# Patient Record
Sex: Female | Born: 1975 | Race: White | Hispanic: No | Marital: Married | Smoking: Never smoker
Health system: Southern US, Community
[De-identification: ages and names within clinical notes are randomized; demographics above are authoritative.]

## PROBLEM LIST (undated history)

## (undated) DIAGNOSIS — Z0000 Encounter for general adult medical examination without abnormal findings: Secondary | ICD-10-CM

## (undated) DIAGNOSIS — Z789 Other specified health status: Secondary | ICD-10-CM

## (undated) HISTORY — PX: OTHER SURGICAL HISTORY: SHX169

## (undated) HISTORY — DX: Encounter for general adult medical examination without abnormal findings: Z00.00

---
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
## 2007-09-11 ENCOUNTER — Ambulatory Visit (HOSPITAL_COMMUNITY): Admission: RE | Admit: 2007-09-11 | Discharge: 2007-09-11 | Payer: Self-pay | Admitting: Obstetrics

## 2008-06-29 ENCOUNTER — Inpatient Hospital Stay (HOSPITAL_COMMUNITY): Admission: AD | Admit: 2008-06-29 | Discharge: 2008-06-29 | Payer: Self-pay | Admitting: Obstetrics and Gynecology

## 2009-01-03 ENCOUNTER — Inpatient Hospital Stay (HOSPITAL_COMMUNITY): Admission: AD | Admit: 2009-01-03 | Discharge: 2009-01-07 | Payer: Self-pay | Admitting: Obstetrics

## 2009-01-03 ENCOUNTER — Inpatient Hospital Stay (HOSPITAL_COMMUNITY): Admission: AD | Admit: 2009-01-03 | Discharge: 2009-01-03 | Payer: Self-pay | Admitting: Obstetrics

## 2009-01-04 ENCOUNTER — Encounter (INDEPENDENT_AMBULATORY_CARE_PROVIDER_SITE_OTHER): Payer: Self-pay | Admitting: Obstetrics

## 2010-11-19 LAB — HIV ANTIBODY (ROUTINE TESTING W REFLEX): HIV: NONREACTIVE

## 2010-11-19 LAB — ABO/RH: RH Type: POSITIVE

## 2011-02-14 LAB — CBC
HCT: 31.9 % — ABNORMAL LOW (ref 36.0–46.0)
HCT: 41.3 % (ref 36.0–46.0)
MCHC: 34.5 g/dL (ref 30.0–36.0)
MCHC: 34.7 g/dL (ref 30.0–36.0)
MCV: 98.9 fL (ref 78.0–100.0)
Platelets: 206 10*3/uL (ref 150–400)
RBC: 4.2 MIL/uL (ref 3.87–5.11)
RDW: 12.5 % (ref 11.5–15.5)
WBC: 13.1 10*3/uL — ABNORMAL HIGH (ref 4.0–10.5)

## 2011-03-19 NOTE — Op Note (Signed)
NAME:  Yvette Ross, CUTBIRTH NO.:  [id number]   MEDICAL RECORD NO.:  [id number]          PATIENT TYPE:  INP   LOCATION:  9106                          FACILITY:  [facility]   PHYSICIAN:  Lendon Colonel, MD   DATE OF BIRTH:  1976-02-24   DATE OF PROCEDURE:  01/04/2009  DATE OF DISCHARGE:                               OPERATIVE REPORT   PREOPERATIVE DIAGNOSES:  Arrest of descent, chorioamnionitis, fetal  tachycardia, and nonreassuring fetal heart tones.   POSTOPERATIVE DIAGNOSES:  Arrest of descent, chorioamnionitis, fetal  tachycardia, and nonreassuring fetal heart tones.   PROCEDURE:  Primary low transverse cesarean section.   SURGEON:  Lendon Colonel, MD   ASSISTANT:  Maxie Better, MD   ANESTHESIA:  Epidural.   FINDINGS:  Female infant in the ROP position, Apgars 2 and 7, weight 7  pounds 5 ounces.  Cord pH 7.17.  Normal uterus, tubes, and ovaries.  Thick meconium.   SPECIMENS:  Placenta, cord blood to Pathology.   ANTIBIOTICS:  Ampicillin 2 g, 80 mg of gentamicin, and 900 mg of  clindamycin.  Clindamycin was after cord clamp.   ESTIMATED BLOOD LOSS:  800.   COMPLICATIONS:  None.   INDICATION:  This is a 35 year old G2, P0 at 43 plus weeks admitted in  spontaneous labor.  The patient with augmentation eventually came to  fully dilated push from the +2 position for 2-1/4 hours with no further  descent, at which time deep variable decelerations and fetal tachycardia  to the 190s were noted.  Maternal fever was also noted.  Decision was  made to proceed with C-section.   PROCEDURE IN DETAIL:  After informed consent was obtained, the patient  was taken to the operating room where after approximately 10 minutes,  epidural anesthesia was found to be adequate.  She was prepped and  draped in the normal sterile fashion.  A Foley catheter had been  inserted in the labor room.  A Pfannenstiel skin incision was made 2 cm  above the pubic symphysis in the  midline with the scalpel, carried  through to the underlying layer of fascia with the Bovie cautery.  The  fascia was incised in the midline and the incision was then extended  laterally with the Mayo scissors.  The inferior aspect of the fascial  incision was grasped with Kocher clamps, elevated up, and the rectus  muscles were dissected off sharply.  The superior aspect of the fascial  incision was grasped with the Kocher clamps, elevated up, and the rectus  muscles were dissected off sharply.  The rectus muscles were separated  sharply with the Mayo scissors in the midline.  The peritoneum was  identified, grasped with pickups x2, and entered sharply.  Of note, the  bladder was not draining well and was noted to be quite high.  The  peritoneal incision was extended superiorly and inferiorly with good  visualization of the bladder.  The bladder blade was inserted.  The  vesicouterine peritoneum was identified, grasped with pickups, and  entered sharply with the Metzenbaum scissors.  The incision was extended  laterally and  a generous bladder flap was created digitally.  The  bladder blade was reinserted, DeLee uterine segment was incised in the  transverse fashion with the scalpel.  The amniotic sac was entered  bluntly.  Thick meconium was noted.  The incision was extended bluntly.  The infant's occiput was grasped, large amount of caput was noted, and  the head was quite low.  However, once the head was grasped, the infant  was rotated and brought to the incision.  The cord was clamped and cut,  and the infant was taken to the awaiting pediatrician for deep  suctioning.  No meconium was noted below the vocal cords.  The placenta  was expressed.  The uterus was cleared of all clots and debris.  The  uterus was unable to be exteriorized given the tight abdominal wall  musculature.  T clamps and rings were placed on the edge of the  incision.  The uterus was again cleaned.  The incision  was tied with 0  Vicryl in a running locked fashion.  Second layer of the same suture was  used in imbricating fashion to obtain excellent hemostasis.  The pelvis  was irrigated.  The ovaries were palpated and then visualized.  Again,  both were normal.  No cystic structures.  The tagged edges of the uterus  were reinspected, found to be hemostatic.  The tagged edges were cut.  After the pelvis was irrigated, the peritoneum was closed with 2-0  Vicryl in a running fashion.  A single loose 2-0 Vicryl suture was used  to reapproximate the rectus muscles in the midline, and the fascia was  closed with 0 Vicryl in a running fashion in two half.  Subcutaneous  tissue was irrigated and the skin was closed with staples.  The patient  tolerated the procedure well.  Sponge, lap, and needle counts were  correct x3, and the patient was taken to the recovery room in stable  condition.      Lendon Colonel, MD  Electronically Signed     KAF/MEDQ  D:  01/04/2009  T:  01/04/2009  Job:  [id number]

## 2011-03-22 NOTE — Discharge Summary (Signed)
NAME:  Yvette Ross, Yvette Ross NO.:  [id number]   MEDICAL RECORD NO.:  [id number]          PATIENT TYPE:  INP   LOCATION:  9106                          FACILITY:  [facility]   PHYSICIAN:  Lendon Colonel, MD   DATE OF BIRTH:  21-Dec-1975   DATE OF ADMISSION:  01/03/2009  DATE OF DISCHARGE:  01/07/2009                               DISCHARGE SUMMARY   CHIEF COMPLAINT:  Induction of labor.   HISTORY OF PRESENT ILLNESS:  This is a 35 year old G2 P0 at 39 and 3 who  presented in early labor with painful contractions.  Prenatal issues  significant for a conception on Clomid and subsequent 9 cm ovarian cyst.  A fetal growth was followed and was normal.  The remainder of past  medical, surgical, obstetrical histories can be seen by her admission H  and P.  On admission, her cervix was 3 cm dilated, 70% effaced with the  vertex at -2 revealed tracing was reactive.   HOSPITAL COURSE:  The patient was admitted in early labor and once 6-cm  dilated and the patient received an epidural, she was artificially  ruptured.  Thick meconium was noted.  Pitocin had been used for  augmentation.  The patient was allowed continued expectant management,  though slow progress of labor was noted and IUPC was placed.  The  patient did slowly progressed to fully dilate in the +3 station.  Temperature maternal had risen to 100.6 consistent with  chorioamnionitis.  Ampicillin and gentamicin were begun and occasional  fetal decelerations were noted.  Given the +3 station and worsening  fetal status, thought was had to a vacuum-assisted vaginal delivery;  however, given the large estimated fetal size, concern for dystocia was  present and the decision was made to proceed with a cesarean section.  A  primary low transverse cesarean section was had for arrest of descent,  chorioamnionitis, and nonreassuring fetal heart testing, produced a  female infant, Apgars 2 and 7 with cord pH of 7.17, weight of 7 pounds  5  ounces.  Normal uterus, tubes, and ovaries, and the EBL of 800 mL.  Postoperatively, the patient recovered well and by postop day #3, she  was ambulating, voiding, tolerating a regular p.o.  Her pain was  controlled with p.o. medicine and the patient was discharged home.   DISCHARGE INSTRUCTIONS:  Call with fever, increased pain, heavy  bleeding.   DISCHARGE MEDICATIONS:  Motrin, Percocet, Colace.   DISCHARGE DISPOSITION:  To home.   DISCHARGE CONDITION:  Stable.   DISCHARGE DIAGNOSIS:  Status post primary low transverse cesarean  section for chorioamnionitis, fetal arrest of descent, nonreassuring  fetal testing.      Lendon Colonel, MD  Electronically Signed    KAF/MEDQ  D:  02/10/2009  T:  02/11/2009  Job:  [id number]

## 2011-06-28 ENCOUNTER — Encounter (HOSPITAL_COMMUNITY): Payer: Self-pay | Admitting: *Deleted

## 2011-06-28 ENCOUNTER — Inpatient Hospital Stay (HOSPITAL_COMMUNITY)
Admission: AD | Admit: 2011-06-28 | Discharge: 2011-07-01 | DRG: 371 | Disposition: A | Discharge status: Home / Self Care | Payer: BC Managed Care – PPO | Source: Ambulatory Visit | Attending: Obstetrics | Admitting: Obstetrics

## 2011-06-28 ENCOUNTER — Inpatient Hospital Stay (HOSPITAL_COMMUNITY): Payer: BC Managed Care – PPO | Admitting: Anesthesiology

## 2011-06-28 ENCOUNTER — Inpatient Hospital Stay (HOSPITAL_COMMUNITY): Payer: BC Managed Care – PPO

## 2011-06-28 ENCOUNTER — Encounter (HOSPITAL_COMMUNITY): Payer: Self-pay | Admitting: Anesthesiology

## 2011-06-28 ENCOUNTER — Encounter (HOSPITAL_COMMUNITY)
Admission: AD | Disposition: A | Discharge status: Home / Self Care | Payer: Self-pay | Source: Ambulatory Visit | Attending: Obstetrics

## 2011-06-28 DIAGNOSIS — O34219 Maternal care for unspecified type scar from previous cesarean delivery: Secondary | ICD-10-CM | POA: Diagnosis present

## 2011-06-28 HISTORY — DX: Other specified health status: Z78.9

## 2011-06-28 LAB — TYPE AND SCREEN
ABO/RH(D): A POS
Antibody Screen: NEGATIVE

## 2011-06-28 LAB — RPR: RPR Ser Ql: NONREACTIVE

## 2011-06-28 LAB — CBC
HCT: 40.9 % (ref 36.0–46.0)
HCT: 37.8 % (ref 36.0–46.0)
Hemoglobin: 13.2 g/dL (ref 12.0–15.0)
MCH: 33 pg (ref 26.0–34.0)
MCHC: 35.2 g/dL (ref 30.0–36.0)
RBC: 4.36 MIL/uL (ref 3.87–5.11)
RBC: 4.02 MIL/uL (ref 3.87–5.11)
WBC: 11.6 10*3/uL — ABNORMAL HIGH (ref 4.0–10.5)

## 2011-06-28 SURGERY — Surgical Case
Anesthesia: Epidural | Site: Abdomen | Wound class: Clean Contaminated

## 2011-06-28 MED ORDER — OXYCODONE-ACETAMINOPHEN 5-325 MG PO TABS: 2.0000 | ORAL_TABLET | ORAL | Status: DC | PRN

## 2011-06-28 MED ORDER — ONDANSETRON HCL 4 MG/2ML IJ SOLN
INTRAMUSCULAR | Status: AC
  Filled 2011-06-28: qty 2

## 2011-06-28 MED ORDER — TERBUTALINE SULFATE 1 MG/ML IJ SOLN
INTRAMUSCULAR | Status: AC
  Administered 2011-06-28: 0.25 mg via SUBCUTANEOUS
  Filled 2011-06-28: qty 1

## 2011-06-28 MED ORDER — STERILE WATER FOR IRRIGATION IR SOLN
Status: DC | PRN
  Administered 2011-06-28: 20:00:00 via INTRAVESICAL

## 2011-06-28 MED ORDER — LIDOCAINE HCL (PF) 1 % IJ SOLN
30.0000 mL | INTRAMUSCULAR | Status: DC | PRN
  Filled 2011-06-28: qty 30

## 2011-06-28 MED ORDER — SIMETHICONE 80 MG PO CHEW
80.0000 mg | CHEWABLE_TABLET | Freq: Three times a day (TID) | ORAL | Status: DC
  Administered 2011-06-29 – 2011-07-01 (×7): 80 mg via ORAL

## 2011-06-28 MED ORDER — MORPHINE SULFATE 0.5 MG/ML IJ SOLN
INTRAMUSCULAR | Status: AC
  Filled 2011-06-28: qty 10

## 2011-06-28 MED ORDER — OXYTOCIN 20 UNITS IN LACTATED RINGERS INFUSION - SIMPLE
INTRAVENOUS | Status: DC | PRN
  Administered 2011-06-28: 20 [IU] via INTRAVENOUS

## 2011-06-28 MED ORDER — ONDANSETRON HCL 4 MG/2ML IJ SOLN
INTRAMUSCULAR | Status: DC | PRN
  Administered 2011-06-28: 4 mg via INTRAVENOUS

## 2011-06-28 MED ORDER — KETOROLAC TROMETHAMINE 30 MG/ML IJ SOLN: 30.0000 mg | Freq: Four times a day (QID) | INTRAMUSCULAR | Status: DC | PRN

## 2011-06-28 MED ORDER — FENTANYL CITRATE 0.05 MG/ML IJ SOLN
INTRAMUSCULAR | Status: AC
  Filled 2011-06-28: qty 2

## 2011-06-28 MED ORDER — CEFAZOLIN SODIUM 1-5 GM-% IV SOLN
INTRAVENOUS | Status: DC | PRN
  Administered 2011-06-28: 1 g via INTRAVENOUS

## 2011-06-28 MED ORDER — SENNOSIDES-DOCUSATE SODIUM 8.6-50 MG PO TABS
2.0000 | ORAL_TABLET | Freq: Every day | ORAL | Status: DC
  Administered 2011-06-29: 2 via ORAL

## 2011-06-28 MED ORDER — DIPHENHYDRAMINE HCL 50 MG/ML IJ SOLN: 25.0000 mg | INTRAMUSCULAR | Status: DC | PRN

## 2011-06-28 MED ORDER — TETANUS-DIPHTH-ACELL PERTUSSIS 5-2.5-18.5 LF-MCG/0.5 IM SUSP
0.5000 mL | Freq: Once | INTRAMUSCULAR | Status: AC
  Administered 2011-06-29: 0.5 mL via INTRAMUSCULAR
  Filled 2011-06-28: qty 0.5

## 2011-06-28 MED ORDER — OXYTOCIN 10 UNIT/ML IJ SOLN
INTRAMUSCULAR | Status: AC
  Filled 2011-06-28: qty 2

## 2011-06-28 MED ORDER — SODIUM BICARBONATE 8.4 % IV SOLN
INTRAVENOUS | Status: AC
  Filled 2011-06-28: qty 50

## 2011-06-28 MED ORDER — MORPHINE SULFATE (PF) 0.5 MG/ML IJ SOLN
INTRAMUSCULAR | Status: DC | PRN
  Administered 2011-06-28: 3 mg via EPIDURAL

## 2011-06-28 MED ORDER — IBUPROFEN 600 MG PO TABS
600.0000 mg | ORAL_TABLET | Freq: Four times a day (QID) | ORAL | Status: DC
  Administered 2011-06-29 – 2011-07-01 (×8): 600 mg via ORAL
  Filled 2011-06-28 (×8): qty 1

## 2011-06-28 MED ORDER — EPHEDRINE 5 MG/ML INJ
10.0000 mg | INTRAVENOUS | Status: DC | PRN
  Filled 2011-06-28: qty 4

## 2011-06-28 MED ORDER — DIBUCAINE 1 % RE OINT
1.0000 "application " | TOPICAL_OINTMENT | RECTAL | Status: DC | PRN
  Administered 2011-07-01: 1 via RECTAL
  Filled 2011-06-28: qty 28

## 2011-06-28 MED ORDER — PHENYLEPHRINE 40 MCG/ML (10ML) SYRINGE FOR IV PUSH (FOR BLOOD PRESSURE SUPPORT)
80.0000 ug | PREFILLED_SYRINGE | INTRAVENOUS | Status: DC | PRN
  Filled 2011-06-28: qty 5

## 2011-06-28 MED ORDER — ONDANSETRON HCL 4 MG/2ML IJ SOLN: 4.0000 mg | Freq: Four times a day (QID) | INTRAMUSCULAR | Status: DC | PRN

## 2011-06-28 MED ORDER — FENTANYL 2.5 MCG/ML BUPIVACAINE 1/10 % EPIDURAL INFUSION (WH - ANES)
14.0000 mL/h | INTRAMUSCULAR | Status: DC
  Administered 2011-06-28 (×2): 14 mL/h via EPIDURAL
  Filled 2011-06-28: qty 60

## 2011-06-28 MED ORDER — SCOPOLAMINE 1 MG/3DAYS TD PT72
1.0000 | MEDICATED_PATCH | Freq: Once | TRANSDERMAL | Status: DC
  Administered 2011-06-28: 1.5 mg via TRANSDERMAL

## 2011-06-28 MED ORDER — PRENATAL PLUS 27-1 MG PO TABS
1.0000 | ORAL_TABLET | Freq: Every day | ORAL | Status: DC
  Administered 2011-06-29 – 2011-07-01 (×3): 1 via ORAL
  Filled 2011-06-28 (×3): qty 1

## 2011-06-28 MED ORDER — SODIUM CHLORIDE 0.9 % IV SOLN
1.0000 ug/kg/h | INTRAVENOUS | Status: DC | PRN
  Filled 2011-06-28: qty 2.5

## 2011-06-28 MED ORDER — FENTANYL 2.5 MCG/ML BUPIVACAINE 1/10 % EPIDURAL INFUSION (WH - ANES)
INTRAMUSCULAR | Status: AC
  Administered 2011-06-28: 14 mL/h via EPIDURAL
  Filled 2011-06-28: qty 60

## 2011-06-28 MED ORDER — OXYTOCIN 20 UNITS IN LACTATED RINGERS INFUSION - SIMPLE: 125.0000 mL/h | INTRAVENOUS | Status: AC

## 2011-06-28 MED ORDER — LACTATED RINGERS IV SOLN: 500.0000 mL | INTRAVENOUS | Status: DC | PRN

## 2011-06-28 MED ORDER — NALBUPHINE HCL 10 MG/ML IJ SOLN: 5.0000 mg | INTRAMUSCULAR | Status: DC | PRN

## 2011-06-28 MED ORDER — LANOLIN HYDROUS EX OINT: 1.0000 "application " | TOPICAL_OINTMENT | CUTANEOUS | Status: DC | PRN

## 2011-06-28 MED ORDER — KETOROLAC TROMETHAMINE 60 MG/2ML IM SOLN
INTRAMUSCULAR | Status: AC
  Administered 2011-06-28: 60 mg via INTRAMUSCULAR
  Filled 2011-06-28: qty 2

## 2011-06-28 MED ORDER — ACETAMINOPHEN 325 MG PO TABS: 650.0000 mg | ORAL_TABLET | ORAL | Status: DC | PRN

## 2011-06-28 MED ORDER — OXYTOCIN 20 UNITS IN LACTATED RINGERS INFUSION - SIMPLE: 125.0000 mL/h | INTRAVENOUS | Status: DC

## 2011-06-28 MED ORDER — WITCH HAZEL-GLYCERIN EX PADS
1.0000 "application " | MEDICATED_PAD | CUTANEOUS | Status: DC | PRN
  Administered 2011-07-01: 1 via TOPICAL

## 2011-06-28 MED ORDER — IBUPROFEN 600 MG PO TABS: 600.0000 mg | ORAL_TABLET | Freq: Four times a day (QID) | ORAL | Status: DC | PRN

## 2011-06-28 MED ORDER — ZOLPIDEM TARTRATE 5 MG PO TABS: 5.0000 mg | ORAL_TABLET | Freq: Every evening | ORAL | Status: DC | PRN

## 2011-06-28 MED ORDER — METOCLOPRAMIDE HCL 5 MG/ML IJ SOLN: 10.0000 mg | Freq: Three times a day (TID) | INTRAMUSCULAR | Status: DC | PRN

## 2011-06-28 MED ORDER — MENTHOL 3 MG MT LOZG: 1.0000 | LOZENGE | OROMUCOSAL | Status: DC | PRN

## 2011-06-28 MED ORDER — LIDOCAINE HCL 1.5 % IJ SOLN
INTRAMUSCULAR | Status: DC | PRN
  Administered 2011-06-28 (×2): 5 mL via EPIDURAL

## 2011-06-28 MED ORDER — LACTATED RINGERS IV SOLN
500.0000 mL | Freq: Once | INTRAVENOUS | Status: AC
  Administered 2011-06-28: 1000 mL via INTRAVENOUS

## 2011-06-28 MED ORDER — LIDOCAINE-EPINEPHRINE (PF) 2 %-1:200000 IJ SOLN
INTRAMUSCULAR | Status: AC
  Filled 2011-06-28: qty 20

## 2011-06-28 MED ORDER — EPHEDRINE SULFATE 50 MG/ML IJ SOLN
INTRAMUSCULAR | Status: DC | PRN
  Administered 2011-06-28: 15 mg via INTRAVENOUS

## 2011-06-28 MED ORDER — SCOPOLAMINE 1 MG/3DAYS TD PT72
MEDICATED_PATCH | TRANSDERMAL | Status: AC
  Administered 2011-06-28: 1.5 mg via TRANSDERMAL
  Filled 2011-06-28: qty 1

## 2011-06-28 MED ORDER — LACTATED RINGERS IV SOLN
INTRAVENOUS | Status: DC
  Administered 2011-06-28: 19:00:00 via INTRAVENOUS
  Administered 2011-06-28: 125 mL/h via INTRAVENOUS
  Administered 2011-06-28: 300 mL via INTRAVENOUS

## 2011-06-28 MED ORDER — DIPHENHYDRAMINE HCL 25 MG PO CAPS: 25.0000 mg | ORAL_CAPSULE | Freq: Four times a day (QID) | ORAL | Status: DC | PRN

## 2011-06-28 MED ORDER — CITRIC ACID-SODIUM CITRATE 334-500 MG/5ML PO SOLN
30.0000 mL | ORAL | Status: DC | PRN
  Administered 2011-06-28: 30 mL via ORAL
  Filled 2011-06-28: qty 15

## 2011-06-28 MED ORDER — OXYCODONE-ACETAMINOPHEN 5-325 MG PO TABS
1.0000 | ORAL_TABLET | ORAL | Status: DC | PRN
  Administered 2011-06-29 (×2): 1 via ORAL
  Administered 2011-06-30: 2 via ORAL
  Administered 2011-06-30 – 2011-07-01 (×6): 1 via ORAL
  Filled 2011-06-28 (×5): qty 1
  Filled 2011-06-28: qty 2
  Filled 2011-06-28: qty 1
  Filled 2011-06-28: qty 2
  Filled 2011-06-28: qty 1

## 2011-06-28 MED ORDER — NALOXONE HCL 0.4 MG/ML IJ SOLN: 0.4000 mg | INTRAMUSCULAR | Status: DC | PRN

## 2011-06-28 MED ORDER — PHENYLEPHRINE 40 MCG/ML (10ML) SYRINGE FOR IV PUSH (FOR BLOOD PRESSURE SUPPORT)
PREFILLED_SYRINGE | INTRAVENOUS | Status: AC
  Filled 2011-06-28: qty 5

## 2011-06-28 MED ORDER — DIPHENHYDRAMINE HCL 25 MG PO CAPS: 25.0000 mg | ORAL_CAPSULE | ORAL | Status: DC | PRN

## 2011-06-28 MED ORDER — LACTATED RINGERS IV SOLN: INTRAVENOUS | Status: DC

## 2011-06-28 MED ORDER — FLEET ENEMA 7-19 GM/118ML RE ENEM: 1.0000 | ENEMA | RECTAL | Status: DC | PRN

## 2011-06-28 MED ORDER — SODIUM CHLORIDE 0.9 % IJ SOLN: 3.0000 mL | INTRAMUSCULAR | Status: DC | PRN

## 2011-06-28 MED ORDER — MEPERIDINE HCL 25 MG/ML IJ SOLN: 6.2500 mg | INTRAMUSCULAR | Status: DC | PRN

## 2011-06-28 MED ORDER — OXYTOCIN BOLUS FROM INFUSION
500.0000 mL | Freq: Once | INTRAVENOUS | Status: DC
  Filled 2011-06-28: qty 500
  Filled 2011-06-28: qty 1000

## 2011-06-28 MED ORDER — KETOROLAC TROMETHAMINE 60 MG/2ML IM SOLN
60.0000 mg | Freq: Once | INTRAMUSCULAR | Status: AC | PRN
  Administered 2011-06-28: 60 mg via INTRAMUSCULAR

## 2011-06-28 MED ORDER — DIPHENHYDRAMINE HCL 50 MG/ML IJ SOLN: 12.5000 mg | INTRAMUSCULAR | Status: DC | PRN

## 2011-06-28 MED ORDER — SIMETHICONE 80 MG PO CHEW: 80.0000 mg | CHEWABLE_TABLET | ORAL | Status: DC | PRN

## 2011-06-28 MED ORDER — EPHEDRINE 5 MG/ML INJ
INTRAVENOUS | Status: AC
  Filled 2011-06-28: qty 4

## 2011-06-28 SURGICAL SUPPLY — 30 items
CHLORAPREP W/TINT 26ML (MISCELLANEOUS) IMPLANT
CLOTH BEACON ORANGE TIMEOUT ST (SAFETY) ×2 IMPLANT
CONTAINER PREFILL 10% NBF 15ML (MISCELLANEOUS) IMPLANT
DRSG COVADERM 4X8 (GAUZE/BANDAGES/DRESSINGS) ×2 IMPLANT
ELECT REM PT RETURN 9FT ADLT (ELECTROSURGICAL) ×2
ELECTRODE REM PT RTRN 9FT ADLT (ELECTROSURGICAL) ×1 IMPLANT
EXTRACTOR VACUUM KIWI (MISCELLANEOUS) IMPLANT
EXTRACTOR VACUUM M CUP 4 TUBE (SUCTIONS) IMPLANT
GLOVE BIO SURGEON STRL SZ 6.5 (GLOVE) ×2 IMPLANT
GLOVE BIOGEL PI IND STRL 7.0 (GLOVE) ×2 IMPLANT
GLOVE BIOGEL PI INDICATOR 7.0 (GLOVE) ×2
GOWN PREVENTION PLUS LG XLONG (DISPOSABLE) ×6 IMPLANT
KIT ABG SYR 3ML LUER SLIP (SYRINGE) IMPLANT
NEEDLE HYPO 25X5/8 SAFETYGLIDE (NEEDLE) IMPLANT
NS IRRIG 1000ML POUR BTL (IV SOLUTION) ×2 IMPLANT
PACK C SECTION WH (CUSTOM PROCEDURE TRAY) ×2 IMPLANT
SLEEVE SCD COMPRESS KNEE MED (MISCELLANEOUS) ×2 IMPLANT
STAPLER VISISTAT 35W (STAPLE) IMPLANT
STRIP CLOSURE SKIN 1/2X4 (GAUZE/BANDAGES/DRESSINGS) IMPLANT
SUT MON AB 4-0 PS1 27 (SUTURE) IMPLANT
SUT PLAIN 0 NONE (SUTURE) IMPLANT
SUT PLAIN 2 0 XLH (SUTURE) IMPLANT
SUT VIC AB 0 CT1 36 (SUTURE) ×6 IMPLANT
SUT VIC AB 0 CTX 36 (SUTURE) ×3
SUT VIC AB 0 CTX36XBRD ANBCTRL (SUTURE) ×3 IMPLANT
SUT VIC AB 2-0 CT1 27 (SUTURE) ×2
SUT VIC AB 2-0 CT1 TAPERPNT 27 (SUTURE) ×2 IMPLANT
TOWEL OR 17X24 6PK STRL BLUE (TOWEL DISPOSABLE) ×4 IMPLANT
TRAY FOLEY CATH 14FR (SET/KITS/TRAYS/PACK) IMPLANT
WATER STERILE IRR 1000ML POUR (IV SOLUTION) IMPLANT

## 2011-06-28 NOTE — H&P (Signed)
Yvette Ross is a 35 y.o. G3P1011 at [redacted]w[redacted]d presenting for contractions. Pt notes  contractions started at 4am . Good fetal movement, No vaginal bleeding, not leaking fluid . Pt planning VBAC  [facility] since 8 wks - h/o infertility, current pregnancy with Femara/ IUI, unexplained infertility - prior c/s- post-dates IOL, CPD after 3 hrs pushing, 7'5,  OB History    Grav Para Term Preterm Abortions TAB SAB Ect Mult Living   3 1 1  0 1 0 1   1     Past Medical History  Diagnosis Date  . No pertinent past medical history    Past Surgical History  Procedure Date  . Cesarean section    Family History: family history is not on file. Social History:  does not have a smoking history on file. She has never used smokeless tobacco. She reports that she does not drink alcohol or use illicit drugs. Increased social stress- mother died during this pregnancy (father died during her last pregnancy) Gyn Hx: unexplained infertility, prior ovarian cyst POBHx: SAB x 1, c/s post-dates  Review of Systems - Negative except contractions   Dilation: 8 Effacement (%): 100 Station: 0 Exam by:: Dixon,RN Height 5\' 3"  (1.6 m), weight 76.658 kg (169 lb).  Physical Exam:  Gen: breathing through contractions CV: RRR Pulm: CTAB Back: no CVAT Abd: gravid, NT, no RUQ pain LE: trace edema, equal bilaterally, non-tender Toco: q 2-3 mint FH: baseline 140s, accelerations present, no deceleratons, 10 beat variability  Prenatal labs: ABO, Rh:   A+ Antibody:  neg Rubella:  immune RPR:   NP HBsAg:   neg HIV:   neg GBS:   meg 1 hr Glucola 128   Genetic screening normal NT, normal AFP Anatomy US normal  Growth: 36 wks: 68%, 6'4, ctx, ant placenta   Assessment/Plan: 35 y.o. G3P1011 at [redacted]w[redacted]d Active labor- allow TOLAC - reactive fetal testing, continue monitoring - Planning epidural - Add T/S to labs - pt understands increased risk of c/s w/ VBAC and maternal and fetal risks w/  failed VBAC and urgent c/s and agrees to proceed GBS neg   Julieta Rogalski A. 06/28/2011, 3:30 PM

## 2011-06-28 NOTE — Progress Notes (Signed)
S: Doing well, no complaints, pain well controlled with epidural. Good movement of legs.   O: BP 115/63  Pulse 76  Temp(Src) 97.4 F (36.3 C) (Oral)  Resp 20  Ht 5\' 3"  (1.6 m)  Wt 76.658 kg (169 lb)  BMI 29.94 kg/m2  SpO2 100%   FHT:  FHR: 125s bpm, variability: moderate,  accelerations:  Present,  decelerations:  Present past 5 ctx w/ decels lasting 1-2.5 minutes w/ good recovery. unclear a/w  ctx as ctx not tracing well, last decels, FSE and O2 placed UC:   regular, every 3 minutes SVE:   Dilation: 8 Effacement (%): 80 Station: 0 Exam by:: dr. Ernestina Penna Exam: fully/ vtx 0 station- AROM clear   A / P:  35 y.o.  Obstetric History   G3   P1   T1   P0   A1   TAB0   SAB1   E0   M0   L1    at [redacted]w[redacted]d Spontaneous labor, progressing normally, now s/p AROM  Fetal Wellbeing:  Category II Pain Control:  Epidural  Anticipated MOD:  NSVD. Increased risk c/s given VBAC  Yvette Ross A. 06/28/2011, 5:42 PM

## 2011-06-28 NOTE — Transfer of Care (Signed)
  Anesthesia Post-op Note  Patient: Yvette Ross  Procedure(s) Performed:  CESAREAN SECTION  Patient Location: PACU  Anesthesia Type: Regional and Epidural  Level of Consciousness: awake, alert  and oriented  Airway and Oxygen Therapy: Patient Spontanous Breathing  Post-op Pain: none  Post-op Assessment: Post-op Vital signs reviewed  Post-op Vital Signs: Reviewed and stable  Complications: No apparent anesthesia complications

## 2011-06-28 NOTE — Anesthesia Procedure Notes (Signed)

## 2011-06-28 NOTE — Anesthesia Preprocedure Evaluation (Addendum)
Anesthesia Evaluation  Name, MR# and DOB Patient awake  General Assessment Comment  Reviewed: Allergy & Precautions, H&P , Patient's Chart, lab work & pertinent test results  Airway Mallampati: II TM Distance: >3 FB Neck ROM: full    Dental No notable dental hx.    Pulmonary  clear to auscultation  pulmonary exam normalPulmonary Exam Normal breath sounds clear to auscultation none    Cardiovascular regular Normal    Neuro/Psych Negative Neurological ROS  Negative Psych ROS  GI/Hepatic/Renal negative GI ROS  negative Liver ROS  negative Renal ROS        Endo/Other  Negative Endocrine ROS (+)      Abdominal   Musculoskeletal   Hematology negative hematology ROS (+)   Peds  Reproductive/Obstetrics (+) Pregnancy    Anesthesia Other Findings             Anesthesia Physical Anesthesia Plan  ASA: II  Anesthesia Plan: Epidural   Post-op Pain Management:    Induction:   Airway Management Planned:   Additional Equipment:   Intra-op Plan:   Post-operative Plan:   Informed Consent: I have reviewed the patients History and Physical, chart, labs and discussed the procedure including the risks, benefits and alternatives for the proposed anesthesia with the patient or authorized representative who has indicated his/her understanding and acceptance.     Plan Discussed with:   Anesthesia Plan Comments:         Anesthesia Quick Evaluation  

## 2011-06-28 NOTE — Anesthesia Postprocedure Evaluation (Signed)
Anesthesia Post Note  Patient: Yvette Ross  Procedure(s) Performed:  CESAREAN SECTION   Patient is awake, responsive, moving her legs, and has signs of resolution of her numbness. Pain and nausea are reasonably well controlled. Vital signs are stable and clinically acceptable. Oxygen saturation is clinically acceptable. There are no apparent anesthetic complications at this time. Patient is ready for discharge.

## 2011-06-28 NOTE — Brief Op Note (Signed)
06/28/2011  8:20 PM  PATIENT:  Yvette Ross  35 y.o. female  PRE-OPERATIVE DIAGNOSIS:  Nonreassuring Fetal Heartrate  POST-OPERATIVE DIAGNOSIS:  Nonreassuring Fetal Heartrate  PROCEDURE:  Procedure(s): CESAREAN SECTION  SURGEON:  Surgeon(s): [facility]. Shaune Malacara  PHYSICIAN ASSISTANT:   ASSISTANTS: Ivonne Andrew   ANESTHESIA:   epidural  ESTIMATED BLOOD LOSS: * No blood loss amount entered * 800cc  BLOOD ADMINISTERED:none  DRAINS: Urinary Catheter (Foley)   LOCAL MEDICATIONS USED:  NONE  SPECIMEN:  Source of Specimen:  cord pH, placenta  DISPOSITION OF SPECIMEN:  LDR  COUNTS:  ACTION TAKEN: X-RAY(S) TAKEN due to no count as stat c/s  TOURNIQUET:  * No tourniquets in log *  DICTATION #: see op noted in Epic  PLAN OF CARE: cont foley for 48 hrs, routine post-op  PATIENT DISPOSITION:  PACU - hemodynamically stable.   Delay start of Pharmacological VTE agent (>24hrs) due to surgical blood loss or risk of bleeding:  Yes, plan SCDs post-op

## 2011-06-28 NOTE — Op Note (Signed)
Operative note for Yvette Ross Preoperative diagnosis nonreassuring fetal testing Postoperative diagnosis nonreassuring fetal testing  surgeon Yvette Ross Asst. Yvette Ross Antibiotics 1 g of Ancef EBL 800 cc Complications none Findings female infant in the DOP position Apgars 9 and 9 cord pH 7.1 nuchal cord x1 meconium-stained amniotic fluid normal uterus normal placenta normal left tube and ovary nonvisualization of the right tube and ovary no palpable right adnexal masses; bladder intact  procedure repeat cesarean section  Indications is a 35 year old G3 P1 planning a trial of labor after cesarean for CPD. The patient presented in active labor at 5 cm dilated. The patient progressed rapidly to 7 cm. She received an epidural and was allowed continued passive descent. At 10 cm she had artificial rupture of membranes for clear amniotic fluid. During this time patient had overall reactive fetal testing although some decelerations were present dropping from the baseline by about 20 beats per minute for up to 2 minutes with good recovery excellent accelerations and good beat-to-beat variability. The patient was allowed continued expectant management. After about 45 minutes from being complete the way to check on patient to reassess her status and upon entry into the room patient shifted position and fetal baseline dropped to the 70s this baseline remained in the 70s for the subsequent 12 minutes despite position change oxygen administration terbutaline administration positioning to all fours and pressure on the fetal head exam revealed a fetal occiput at the +1 station fully dilated no cord. Patient's belly was soft and there is no change in maternal vitals. at this point given the VBAC status and the nonreassuring fetal testing decision was made to proceed with urgent cesarean section  Procedure patient was rapidly taken to the OR where epidural anesthesia was found to be adequate a Foley catheter was placed  in the labor room. She was prepped with Betadine prep and dried with a sterile towel sterile drape was placed. A Pfannenstiel sit skin incision was made 2 cm above the pubic symphysis over the old Pfannenstiel scar with the scalpel. This was carried through to the underlying layer of fascia with the scalpel. The fascia was incised with the scalpel. The remainder of the fascia was extended bluntly. The peritoneum was seen at this point was entered bluntly the peritoneal incision was extended bluntly. The Mayo scissors were used to further extend the peritoneal incision and also to further extend the fascial incision when an adequate room was noted. The bladder blade was inserted the bladder reflection was noted we attempted to stay above the bladder reflection. The scalpel was used to make an incision in the uterus. The incision was extended bluntly. The infant's occiput was grasped brought to the incision. The infant was found to be floppy nuchal cord was noted immediate reduction of the nuchal cord was done and spontaneous cry was noted. The remainder of the infant was delivered the cord was clamped and cut and the infant was handed off to the waiting pediatrician. Cord gas was obtained. Cord pH was obtained. The placenta was expressed. The uterus was not able to be exteriorized due to the small fascial incision. The bladder blade was inserted the uterus was cleared of all clots and debris the edges of the uterus were tagged and the uterine incision was repaired with 0 Vicryl in a running locked fashion. A second layer of the same suture was used in imbricating fashion to obtain good hemostasis. At this point the bladder was fully evaluated the Foley was felt down low  in the pelvis however given the prior C-section and the blood-tinged urine I felt further evaluation of the bladder was needed the bladder was backfilled with 300 cc of sterile milk. At this point and didn't see any defects along the bladder serosa  however no defect was noted in the bladder muscularis or the bladder mucosa. A 2-0 Vicryl and a CT1 using used to oversew the bladder serosa and provide additional support. The uterine incision was reinspected a small amount of bleeding was noted from the left apex of the incision and a single suture was placed for hemostasis was then noted the gutters were cleared of all clots and debris. Uterine incision was reinspected and found to be hemostatic the peritoneal edges were closed with 2-0 Vicryl in a running fashion. The cut muscle edges underside of the fascia were inspected found to be hemostatic the fascia was closed with 0 Vicryl in a running fashion in 2 halves the Scarpa's layer was closed with a 20 plain and the skin was closed with staples. Patient tolerated the procedure well sponge lap and needle counts were correct x3 the patient takes recovery room in stable condition. X-rays being performed to confirm absence of foreign body in the abdomen.  Yvette Ross 06/28/2011, 8:30 PM

## 2011-06-29 LAB — CBC
HCT: 30.7 % — ABNORMAL LOW (ref 36.0–46.0)
MCHC: 34.5 g/dL (ref 30.0–36.0)
Platelets: 134 10*3/uL — ABNORMAL LOW (ref 150–400)
RDW: 12.8 % (ref 11.5–15.5)
WBC: 13 10*3/uL — ABNORMAL HIGH (ref 4.0–10.5)

## 2011-06-29 NOTE — Anesthesia Postprocedure Evaluation (Signed)
  Anesthesia Post-op Note  Patient: Yvette Ross  Procedure(s) Performed:  CESAREAN SECTION  Patient Location: PACU and Mother/Baby  Anesthesia Type: Epidural  Level of Consciousness: awake, alert  and oriented  Airway and Oxygen Therapy: Patient Spontanous Breathing  Post-op Pain: none  Post-op Assessment: Patient's Cardiovascular Status Stable, Respiratory Function Stable, Patent Airway, No signs of Nausea or vomiting and Adequate PO intake  Post-op Vital Signs: stable  Complications: No apparent anesthesia complications

## 2011-06-29 NOTE — Progress Notes (Signed)
POSTOPERATIVE DAY 1  S:         Reports feeling ok - some soreness             Tolerating po intake / no nausea / no vomiting / no flatus / no BM             Bleeding is light             Pain controlled withprescription NSAID's including motrin and narcotic analgesics including percocet             Up ad lib / ambulatory  Newborn breast feeding  / female newborn   O:  A & O x 3 NAD / awake feeding newborn             VS: Blood pressure 118/68, pulse 95, temperature 98.4 F (36.9 C), temperature source Axillary, resp. rate 18, height 5\' 3"  (1.6 m), weight 76.658 kg (169 lb), SpO2 96.00%, unknown if currently breastfeeding.  LABS:  Basename 06/29/11 0555 06/28/11 1655  HGB 10.6* 13.2  HCT 30.7* 37.8    I&O:     I/O this shift: In: 2000 [I.V.:2000] Out: 1200 [Urine:400; Blood:800]   Lungs: Clear and unlabored  Heart: regular rate and rhythm / no mumurs  Abdomen: soft, non-tender, non-distended, BS hypoactive             Fundus: firm, non-tender             Dressing intact without drainage              Perineum:no edema / foley draining dark tea colored urine - dark blood & amber urine color mixed  Lochia: light  Extremities: slight edema, no calf pain or tenderness, negative Homans  A:        POD # 1 S/P repeat cesarean section - fetal bradycardia            Surgeon concern for bladder trauma - urine dark with bloody amber appearance  P:        Routine postoperative care              FOLEY to remain in place until clear urine                        ok to advance diet and remove IV when stable / may consider leg tomorrow / avoid bladder distention after foley removal     Cellie Dardis 06/29/2011, 6:37 AM

## 2011-06-30 MED ORDER — BISACODYL 10 MG RE SUPP
10.0000 mg | Freq: Every day | RECTAL | Status: DC | PRN
  Administered 2011-06-30: 10 mg via RECTAL
  Filled 2011-06-30: qty 1

## 2011-06-30 NOTE — Progress Notes (Signed)
POSTOPERATIVE DAY 2  S:         Reports feeling well but really a lot more muscle soreness and stiffness today             Tolerating po intake / no nausea / no vomiting / + flatus / no BM             Bleeding is light             Pain controlled withprescription NSAID's including motrin and narcotic analgesics including percocet             Up ad lib / ambulatory  Newborn breastfeeding    O:  A & O x 3 NAD - ambulating in halls this am             VS: Blood pressure 117/69, pulse 78, temperature 98.3 F (36.8 C), temperature source Oral, resp. rate 18, height 5\' 3"  (1.6 m), weight 76.658 kg (169 lb), SpO2 95.00%, unknown if currently breastfeeding.  LABS:  Basename 06/29/11 0555 06/28/11 1655  HGB 10.6* 13.2  HCT 30.7* 37.8    I&O: I/O last 3 completed shifts: In: 3000 [I.V.:3000] Out: 6600 [Urine:5800; Blood:800]   I/O this shift: In: -  Out: 550 [Urine:550]  Lungs: Clear and unlabored  Heart: regular rate and rhythm / no mumurs  Abdomen: soft, non-tender, non-distended, bowel sounds normal - active             Fundus: firm, non-tender, Ueven             Dressing off              Incision:  approximated with staples / no erythema / no ecchymosis / no drainage  Perineum: no edema  Lochia:  light  Extremities: trace edema, no calf pain or tenderness, negative Homans             Foley draining clear pale yellow urine  A:        POD # 2 S/P cesarean section             P:        Routine postoperative care              Discontinue foley this am - avoid bladder distention ( void every 2-4 hours)             Anticipate discharge in am     Orlando Devereux 06/30/2011, 9:37 AM

## 2011-07-01 MED ORDER — NYSTATIN-TRIAMCINOLONE 100000-0.1 UNIT/GM-% EX OINT: TOPICAL_OINTMENT | Freq: Two times a day (BID) | CUTANEOUS | Status: DC

## 2011-07-01 MED ORDER — OXYCODONE-ACETAMINOPHEN 5-325 MG PO TABS: 1.0000 | ORAL_TABLET | ORAL | Status: AC | PRN

## 2011-07-01 MED ORDER — IBUPROFEN 600 MG PO TABS: 600.0000 mg | ORAL_TABLET | Freq: Four times a day (QID) | ORAL | Status: AC

## 2011-07-01 NOTE — Progress Notes (Signed)
POSTOPERATIVE DAY 3  S:         Reports feeling well - better than yesterday             Tolerating po intake / no nausea / no vomiting / + flatus / + BM             Bleeding is spotting             Pain controlled withprescription NSAID's including motrin and narcotic analgesics including percocet             Up ad lib / ambulatory  Newborn breast feeding     O:  A & O x 3 NAD             VS: Blood pressure 116/70, pulse 65, temperature 97.9 F (36.6 C), temperature source Oral, resp. rate 18, height 5\' 3"  (1.6 m), weight 76.658 kg (169 lb), SpO2 95.00%, unknown if currently breastfeeding.  LABS:  Basename 06/29/11 0555 06/28/11 1655  HGB 10.6* 13.2  HCT 30.7* 37.8    I&O: I/O last 3 completed shifts: In: -  Out: 3000 [Urine:3000]      Lungs: Clear and unlabored  Heart: regular rate and rhythm / no mumurs  Abdomen: soft, non-tender, non-distended              Fundus: firm, non-tender, U-1             Dressing OFF              Incision:  approximated with staples / no erythema / no ecchymosis / dried drainage at midline  Perineum: no edema / small pink hemorrhoid  Lochia: light  Extremities: trace edema, no calf pain or tenderness, negtaive Homans  A:        POD # 3 S/P cesarean section              P:        Routine postoperative care              Discharge home     [facility] 07/01/2011, 9:04 AM

## 2011-07-01 NOTE — Discharge Summary (Signed)
Obstetric Discharge Summary Reason for Admission: onset of labor / previous cesarean section for TOLAC Prenatal Procedures: none Intrapartum Procedures: cesarean: low cervical, transverse and epidural anesthesia intrapartum Postpartum Procedures: none Complications-Operative and Postpartum: suspected bladder trauma from emergent cesarean section - foley left in place x 24 hours with clear urin outpt at time of removal / no residual distention Hemoglobin  Date Value Range Status  06/29/2011 10.6* 12.0-15.0 (g/dL) Final     DELTA CHECK NOTED     REPEATED TO VERIFY     HCT  Date Value Range Status  06/29/2011 30.7* 36.0-46.0 (%) Final    Discharge Diagnoses: Term Pregnancy-delivered / Repeat cesarean section / Failed TOLAC secondary to fetal bradycardia Discharge status: Physical exam within normal limits. Wound edges well-approximated with staples with small dried drainage at incision midline - staples to remain intact until day 6-7 postop. Voiding QS without any urinary symptoms / urine remains clear postoperatively. Breastfeeding well - no concerns.  Discharge Information: Date: 07/01/2011 Activity: pelvic rest and postoperative activty restrictions Diet: routine Medications: PNV, Ibuprophen, Colace and Percocet Condition: stable Instructions: refer to practice specific booklet Discharge to: home Follow-up Information    Follow up with [facility].. Make an appointment in 6 weeks. (Staple removal in 3-4 days)    Contact information:   [address] [place] [address] [phone]          Newborn Data: Live born female  Birth Weight: 7 lb 7 oz (3374 g) APGAR: 9, 9  Home with mother.  Yvette Ross 07/01/2011, 9:07 AM

## 2011-07-16 ENCOUNTER — Encounter (HOSPITAL_COMMUNITY): Payer: Self-pay | Admitting: Obstetrics

## 2012-06-18 ENCOUNTER — Ambulatory Visit (INDEPENDENT_AMBULATORY_CARE_PROVIDER_SITE_OTHER): Payer: BC Managed Care – PPO | Admitting: Internal Medicine

## 2012-06-18 ENCOUNTER — Encounter: Payer: Self-pay | Admitting: Internal Medicine

## 2012-06-18 VITALS — BP 110/68 | HR 69 | Temp 97.3°F | Ht 63.0 in | Wt 135.0 lb

## 2012-06-18 DIAGNOSIS — M545 Low back pain, unspecified: Secondary | ICD-10-CM

## 2012-06-18 DIAGNOSIS — D239 Other benign neoplasm of skin, unspecified: Secondary | ICD-10-CM

## 2012-06-18 DIAGNOSIS — Z0000 Encounter for general adult medical examination without abnormal findings: Secondary | ICD-10-CM

## 2012-06-18 DIAGNOSIS — D229 Melanocytic nevi, unspecified: Secondary | ICD-10-CM

## 2012-06-18 MED ORDER — NAPROXEN SODIUM 220 MG PO TABS: 220.0000 mg | ORAL_TABLET | Freq: Three times a day (TID) | ORAL | Status: DC

## 2012-06-18 NOTE — Patient Instructions (Addendum)
It was good to see you today. Test(s) ordered today. Return for lab only when you are fasting. Your results will be called to you after review (48-72hours after test completion). If any changes need to be made, you will be notified at that time. Health Maintenance reviewed - all recommended immunizations and age-appropriate screenings are up-to-date.  Yvette Ross exercises for back strengthening as discussed - call if recurrent or persisting problems with back pain radiating into legs if unrelieved with rest and ibuprofen or aleve Preventive Care for Adults, Female A healthy lifestyle and preventive care can promote health and wellness. Preventive health guidelines for women include the following key practices.  A routine yearly physical is a good way to check with your caregiver about your health and preventive screening. It is a chance to share any concerns and updates on your health, and to receive a thorough exam.   Visit your dentist for a routine exam and preventive care every 6 months. Brush your teeth twice a day and floss once a day. Good oral hygiene prevents tooth decay and gum disease.   The frequency of eye exams is based on your age, health, family medical history, use of contact lenses, and other factors. Follow your caregiver's recommendations for frequency of eye exams.   Eat a healthy diet. Foods like vegetables, fruits, whole grains, low-fat dairy products, and lean protein foods contain the nutrients you need without too many calories. Decrease your intake of foods high in solid fats, added sugars, and salt. Eat the right amount of calories for you. Get information about a proper diet from your caregiver, if necessary.   Regular physical exercise is one of the most important things you can do for your health. Most adults should get at least 150 minutes of moderate-intensity exercise (any activity that increases your heart rate and causes you to sweat) each week. In addition, most  adults need muscle-strengthening exercises on 2 or more days a week.   Maintain a healthy weight. The body mass index (BMI) is a screening tool to identify possible weight problems. It provides an estimate of body fat based on height and weight. Your caregiver can help determine your BMI, and can help you achieve or maintain a healthy weight. For adults 20 years and older:   A BMI below 18.5 is considered underweight.   A BMI of 18.5 to 24.9 is normal.   A BMI of 25 to 29.9 is considered overweight.   A BMI of 30 and above is considered obese.   Maintain normal blood lipids and cholesterol levels by exercising and minimizing your intake of saturated fat. Eat a balanced diet with plenty of fruit and vegetables. Blood tests for lipids and cholesterol should begin at age 68 and be repeated every 5 years. If your lipid or cholesterol levels are high, you are over 50, or you are at high risk for heart disease, you may need your cholesterol levels checked more frequently. Ongoing high lipid and cholesterol levels should be treated with medicines if diet and exercise are not effective.   If you smoke, find out from your caregiver how to quit. If you do not use tobacco, do not start.   If you are pregnant, do not drink alcohol. If you are breastfeeding, be very cautious about drinking alcohol. If you are not pregnant and choose to drink alcohol, do not exceed 1 drink per day. One drink is considered to be 12 ounces (355 mL) of beer, 5 ounces (148 mL)  of wine, or 1.5 ounces (44 mL) of liquor.   Avoid use of street drugs. Do not share needles with anyone. Ask for help if you need support or instructions about stopping the use of drugs.   High blood pressure causes heart disease and increases the risk of stroke. Your blood pressure should be checked at least every 1 to 2 years. Ongoing high blood pressure should be treated with medicines if weight loss and exercise are not effective.   If you are 43 to 36  years old, ask your caregiver if you should take aspirin to prevent strokes.   Diabetes screening involves taking a blood sample to check your fasting blood sugar level. This should be done once every 3 years, after age 31, if you are within normal weight and without risk factors for diabetes. Testing should be considered at a younger age or be carried out more frequently if you are overweight and have at least 1 risk factor for diabetes.   Breast cancer screening is essential preventive care for women. You should practice "breast self-awareness." This means understanding the normal appearance and feel of your breasts and may include breast self-examination. Any changes detected, no matter how small, should be reported to a caregiver. Women in their 11s and 30s should have a clinical breast exam (CBE) by a caregiver as part of a regular health exam every 1 to 3 years. After age 47, women should have a CBE every year. Starting at age 20, women should consider having a mammography (breast X-ray test) every year. Women who have a family history of breast cancer should talk to their caregiver about genetic screening. Women at a high risk of breast cancer should talk to their caregivers about having magnetic resonance imaging (MRI) and a mammography every year.   The Pap test is a screening test for cervical cancer. A Pap test can show cell changes on the cervix that might become cervical cancer if left untreated. A Pap test is a procedure in which cells are obtained and examined from the lower end of the uterus (cervix).   Women should have a Pap test starting at age 107.   Between ages 84 and 58, Pap tests should be repeated every 2 years.   Beginning at age 73, you should have a Pap test every 3 years as long as the past 3 Pap tests have been normal.   Some women have medical problems that increase the chance of getting cervical cancer. Talk to your caregiver about these problems. It is especially  important to talk to your caregiver if a new problem develops soon after your last Pap test. In these cases, your caregiver may recommend more frequent screening and Pap tests.   The above recommendations are the same for women who have or have not gotten the vaccine for human papillomavirus (HPV).   If you had a hysterectomy for a problem that was not cancer or a condition that could lead to cancer, then you no longer need Pap tests. Even if you no longer need a Pap test, a regular exam is a good idea to make sure no other problems are starting.   If you are between ages 44 and 33, and you have had normal Pap tests going back 10 years, you no longer need Pap tests. Even if you no longer need a Pap test, a regular exam is a good idea to make sure no other problems are starting.   If you have had  past treatment for cervical cancer or a condition that could lead to cancer, you need Pap tests and screening for cancer for at least 20 years after your treatment.   If Pap tests have been discontinued, risk factors (such as a new sexual partner) need to be reassessed to determine if screening should be resumed.   The HPV test is an additional test that may be used for cervical cancer screening. The HPV test looks for the virus that can cause the cell changes on the cervix. The cells collected during the Pap test can be tested for HPV. The HPV test could be used to screen women aged 72 years and older, and should be used in women of any age who have unclear Pap test results. After the age of 38, women should have HPV testing at the same frequency as a Pap test.   Colorectal cancer can be detected and often prevented. Most routine colorectal cancer screening begins at the age of 48 and continues through age 71. However, your caregiver may recommend screening at an earlier age if you have risk factors for colon cancer. On a yearly basis, your caregiver may provide home test kits to check for hidden blood in the  stool. Use of a small camera at the end of a tube, to directly examine the colon (sigmoidoscopy or colonoscopy), can detect the earliest forms of colorectal cancer. Talk to your caregiver about this at age 64, when routine screening begins.  Direct examination of the colon should be repeated every 5 to 10 years through age 68, unless early forms of pre-cancerous polyps or small growths are found.   Hepatitis C blood testing is recommended for all people born from [birth year] through 1965 and any individual with known risks for hepatitis C.   Practice safe sex. Use condoms and avoid high-risk sexual practices to reduce the spread of sexually transmitted infections (STIs). STIs include gonorrhea, chlamydia, syphilis, trichomonas, herpes, HPV, and human immunodeficiency virus (HIV). Herpes, HIV, and HPV are viral illnesses that have no cure. They can result in disability, cancer, and death. Sexually active women aged 50 and younger should be checked for chlamydia. Older women with new or multiple partners should also be tested for chlamydia. Testing for other STIs is recommended if you are sexually active and at increased risk.   Osteoporosis is a disease in which the bones lose minerals and strength with aging. This can result in serious bone fractures. The risk of osteoporosis can be identified using a bone density scan. Women ages 28 and over and women at risk for fractures or osteoporosis should discuss screening with their caregivers. Ask your caregiver whether you should take a calcium supplement or vitamin D to reduce the rate of osteoporosis.   Menopause can be associated with physical symptoms and risks. Hormone replacement therapy is available to decrease symptoms and risks. You should talk to your caregiver about whether hormone replacement therapy is right for you.   Use sunscreen with sun protection factor (SPF) of 30 or more. Apply sunscreen liberally and repeatedly throughout the day. You should seek  shade when your shadow is shorter than you. Protect yourself by wearing long sleeves, pants, a wide-brimmed hat, and sunglasses year round, whenever you are outdoors.   Once a month, do a whole body skin exam, using a mirror to look at the skin on your back. Notify your caregiver of new moles, moles that have irregular borders, moles that are larger than a pencil eraser, or  moles that have changed in shape or color.   Stay current with required immunizations.   Influenza. You need a dose every fall (or winter). The composition of the flu vaccine changes each year, so being vaccinated once is not enough.   Pneumococcal polysaccharide. You need 1 to 2 doses if you smoke cigarettes or if you have certain chronic medical conditions. You need 1 dose at age 76 (or older) if you have never been vaccinated.   Tetanus, diphtheria, pertussis (Tdap, Td). Get 1 dose of Tdap vaccine if you are younger than age 57, are over 64 and have contact with an infant, are a Research scientist (physical sciences), are pregnant, or simply want to be protected from whooping cough. After that, you need a Td booster dose every 10 years. Consult your caregiver if you have not had at least 3 tetanus and diphtheria-containing shots sometime in your life or have a deep or dirty wound.   HPV. You need this vaccine if you are a woman age 25 or younger. The vaccine is given in 3 doses over 6 months.   Measles, mumps, rubella (MMR). You need at least 1 dose of MMR if you were born in 1957 or later. You may also need a second dose.   Meningococcal. If you are age 32 to 58 and a first-year college student living in a residence hall, or have one of several medical conditions, you need to get vaccinated against meningococcal disease. You may also need additional booster doses.   Zoster (shingles). If you are age 50 or older, you should get this vaccine.   Varicella (chickenpox). If you have never had chickenpox or you were vaccinated but received only 1  dose, talk to your caregiver to find out if you need this vaccine.   Hepatitis A. You need this vaccine if you have a specific risk factor for hepatitis A virus infection or you simply wish to be protected from this disease. The vaccine is usually given as 2 doses, 6 to 18 months apart.   Hepatitis B. You need this vaccine if you have a specific risk factor for hepatitis B virus infection or you simply wish to be protected from this disease. The vaccine is given in 3 doses, usually over 6 months.  Preventive Services / Frequency Ages 57 to 74  Blood pressure check.** / Every 1 to 2 years.   Lipid and cholesterol check.** / Every 5 years beginning at age 28.   Clinical breast exam.** / Every 3 years for women in their 69s and 30s.   Pap test.** / Every 2 years from ages 59 through 31. Every 3 years starting at age 47 through age 73 or 8 with a history of 3 consecutive normal Pap tests.   HPV screening.** / Every 3 years from ages 70 through ages 6 to 19 with a history of 3 consecutive normal Pap tests.   Hepatitis C blood test.** / For any individual with known risks for hepatitis C.   Skin self-exam. / Monthly.   Influenza immunization.** / Every year.   Pneumococcal polysaccharide immunization.** / 1 to 2 doses if you smoke cigarettes or if you have certain chronic medical conditions.   Tetanus, diphtheria, pertussis (Tdap, Td) immunization. / A one-time dose of Tdap vaccine. After that, you need a Td booster dose every 10 years.   HPV immunization. / 3 doses over 6 months, if you are 54 and younger.   Measles, mumps, rubella (MMR) immunization. / You need  at least 1 dose of MMR if you were born in 1957 or later. You may also need a second dose.   Meningococcal immunization. / 1 dose if you are age 61 to 56 and a first-year college student living in a residence hall, or have one of several medical conditions, you need to get vaccinated against meningococcal disease. You may also  need additional booster doses.   Varicella immunization.** / Consult your caregiver.   Hepatitis A immunization.** / Consult your caregiver. 2 doses, 6 to 18 months apart.   Hepatitis B immunization.** / Consult your caregiver. 3 doses usually over 6 months.  Ages 39 to 25  Blood pressure check.** / Every 1 to 2 years.   Lipid and cholesterol check.** / Every 5 years beginning at age 65.   Clinical breast exam.** / Every year after age 77.   Mammogram.** / Every year beginning at age 21 and continuing for as long as you are in good health. Consult with your caregiver.   Pap test.** / Every 3 years starting at age 82 through age 75 or 29 with a history of 3 consecutive normal Pap tests.   HPV screening.** / Every 3 years from ages 35 through ages 40 to 67 with a history of 3 consecutive normal Pap tests.   Fecal occult blood test (FOBT) of stool. / Every year beginning at age 19 and continuing until age 37. You may not need to do this test if you get a colonoscopy every 10 years.   Flexible sigmoidoscopy or colonoscopy.** / Every 5 years for a flexible sigmoidoscopy or every 10 years for a colonoscopy beginning at age 33 and continuing until age 42.   Hepatitis C blood test.** / For all people born from [birth year] through 1965 and any individual with known risks for hepatitis C.   Skin self-exam. / Monthly.   Influenza immunization.** / Every year.   Pneumococcal polysaccharide immunization.** / 1 to 2 doses if you smoke cigarettes or if you have certain chronic medical conditions.   Tetanus, diphtheria, pertussis (Tdap, Td) immunization.** / A one-time dose of Tdap vaccine. After that, you need a Td booster dose every 10 years.   Measles, mumps, rubella (MMR) immunization. / You need at least 1 dose of MMR if you were born in 1957 or later. You may also need a second dose.   Varicella immunization.** / Consult your caregiver.   Meningococcal immunization.** / Consult your caregiver.     Hepatitis A immunization.** / Consult your caregiver. 2 doses, 6 to 18 months apart.   Hepatitis B immunization.** / Consult your caregiver. 3 doses, usually over 6 months.  Ages 23 and over  Blood pressure check.** / Every 1 to 2 years.   Lipid and cholesterol check.** / Every 5 years beginning at age 29.   Clinical breast exam.** / Every year after age 42.   Mammogram.** / Every year beginning at age 46 and continuing for as long as you are in good health. Consult with your caregiver.   Pap test.** / Every 3 years starting at age 2 through age 50 or 43 with a 3 consecutive normal Pap tests. Testing can be stopped between 65 and 70 with 3 consecutive normal Pap tests and no abnormal Pap or HPV tests in the past 10 years.   HPV screening.** / Every 3 years from ages 7 through ages 74 or 57 with a history of 3 consecutive normal Pap tests. Testing can be stopped between  65 and 70 with 3 consecutive normal Pap tests and no abnormal Pap or HPV tests in the past 10 years.   Fecal occult blood test (FOBT) of stool. / Every year beginning at age 1 and continuing until age 68. You may not need to do this test if you get a colonoscopy every 10 years.   Flexible sigmoidoscopy or colonoscopy.** / Every 5 years for a flexible sigmoidoscopy or every 10 years for a colonoscopy beginning at age 50 and continuing until age 24.   Hepatitis C blood test.** / For all people born from [birth year] through 1965 and any individual with known risks for hepatitis C.   Osteoporosis screening.** / A one-time screening for women ages 45 and over and women at risk for fractures or osteoporosis.   Skin self-exam. / Monthly.   Influenza immunization.** / Every year.   Pneumococcal polysaccharide immunization.** / 1 dose at age 14 (or older) if you have never been vaccinated.   Tetanus, diphtheria, pertussis (Tdap, Td) immunization. / A one-time dose of Tdap vaccine if you are over 65 and have contact with an  infant, are a Research scientist (physical sciences), or simply want to be protected from whooping cough. After that, you need a Td booster dose every 10 years.   Varicella immunization.** / Consult your caregiver.   Meningococcal immunization.** / Consult your caregiver.   Hepatitis A immunization.** / Consult your caregiver. 2 doses, 6 to 18 months apart.   Hepatitis B immunization.** / Check with your caregiver. 3 doses, usually over 6 months.  ** Family history and personal history of risk and conditions may change your caregiver's recommendations. Document Released: 12/17/2001 Document Revised: 10/10/2011 Document Reviewed: 03/18/2011 [facility] Patient Information 2012 [place], [place].Health Maintenance, Females A healthy lifestyle and preventative care can promote health and wellness.  Maintain regular health, dental, and eye exams.   Eat a healthy diet. Foods like vegetables, fruits, whole grains, low-fat dairy products, and lean protein foods contain the nutrients you need without too many calories. Decrease your intake of foods high in solid fats, added sugars, and salt. Get information about a proper diet from your caregiver, if necessary.   Regular physical exercise is one of the most important things you can do for your health. Most adults should get at least 150 minutes of moderate-intensity exercise (any activity that increases your heart rate and causes you to sweat) each week. In addition, most adults need muscle-strengthening exercises on 2 or more days a week.     Maintain a healthy weight. The body mass index (BMI) is a screening tool to identify possible weight problems. It provides an estimate of body fat based on height and weight. Your caregiver can help determine your BMI, and can help you achieve or maintain a healthy weight. For adults 20 years and older:   A BMI below 18.5 is considered underweight.   A BMI of 18.5 to 24.9 is normal.   A BMI of 25 to 29.9 is considered overweight.    A BMI of 30 and above is considered obese.   Maintain normal blood lipids and cholesterol by exercising and minimizing your intake of saturated fat. Eat a balanced diet with plenty of fruits and vegetables. Blood tests for lipids and cholesterol should begin at age 41 and be repeated every 5 years. If your lipid or cholesterol levels are high, you are over 50, or you are a high risk for heart disease, you may need your cholesterol levels checked more frequently. Ongoing  high lipid and cholesterol levels should be treated with medicines if diet and exercise are not effective.   If you smoke, find out from your caregiver how to quit. If you do not use tobacco, do not start.   If you are pregnant, do not drink alcohol. If you are breastfeeding, be very cautious about drinking alcohol. If you are not pregnant and choose to drink alcohol, do not exceed 1 drink per day. One drink is considered to be 12 ounces (355 mL) of beer, 5 ounces (148 mL) of wine, or 1.5 ounces (44 mL) of liquor.   Avoid use of street drugs. Do not share needles with anyone. Ask for help if you need support or instructions about stopping the use of drugs.   High blood pressure causes heart disease and increases the risk of stroke. Blood pressure should be checked at least every 1 to 2 years. Ongoing high blood pressure should be treated with medicines, if weight loss and exercise are not effective.   If you are 82 to 36 years old, ask your caregiver if you should take aspirin to prevent strokes.   Diabetes screening involves taking a blood sample to check your fasting blood sugar level. This should be done once every 3 years, after age 67, if you are within normal weight and without risk factors for diabetes. Testing should be considered at a younger age or be carried out more frequently if you are overweight and have at least 1 risk factor for diabetes.   Breast cancer screening is essential preventative care for women. You  should practice "breast self-awareness." This means understanding the normal appearance and feel of your breasts and may include breast self-examination. Any changes detected, no matter how small, should be reported to a caregiver. Women in their 59s and 30s should have a clinical breast exam (CBE) by a caregiver as part of a regular health exam every 1 to 3 years. After age 7, women should have a CBE every year. Starting at age 63, women should consider having a mammogram (breast X-ray) every year. Women who have a family history of breast cancer should talk to their caregiver about genetic screening. Women at a high risk of breast cancer should talk to their caregiver about having an MRI and a mammogram every year.   The Pap test is a screening test for cervical cancer. Women should have a Pap test starting at age 95. Between ages 3 and 1, Pap tests should be repeated every 2 years. Beginning at age 80, you should have a Pap test every 3 years as long as the past 3 Pap tests have been normal. If you had a hysterectomy for a problem that was not cancer or a condition that could lead to cancer, then you no longer need Pap tests. If you are between ages 14 and 48, and you have had normal Pap tests going back 10 years, you no longer need Pap tests. If you have had past treatment for cervical cancer or a condition that could lead to cancer, you need Pap tests and screening for cancer for at least 20 years after your treatment. If Pap tests have been discontinued, risk factors (such as a new sexual partner) need to be reassessed to determine if screening should be resumed. Some women have medical problems that increase the chance of getting cervical cancer. In these cases, your caregiver may recommend more frequent screening and Pap tests.   The human papillomavirus (HPV) test is an  additional test that may be used for cervical cancer screening. The HPV test looks for the virus that can cause the cell changes on  the cervix. The cells collected during the Pap test can be tested for HPV. The HPV test could be used to screen women aged 29 years and older, and should be used in women of any age who have unclear Pap test results. After the age of 56, women should have HPV testing at the same frequency as a Pap test.   Colorectal cancer can be detected and often prevented. Most routine colorectal cancer screening begins at the age of 53 and continues through age 68. However, your caregiver may recommend screening at an earlier age if you have risk factors for colon cancer. On a yearly basis, your caregiver may provide home test kits to check for hidden blood in the stool. Use of a small camera at the end of a tube, to directly examine the colon (sigmoidoscopy or colonoscopy), can detect the earliest forms of colorectal cancer. Talk to your caregiver about this at age 3, when routine screening begins. Direct examination of the colon should be repeated every 5 to 10 years through age 14, unless early forms of pre-cancerous polyps or small growths are found.   Hepatitis C blood testing is recommended for all people born from [birth year] through 1965 and any individual with known risks for hepatitis C.   Practice safe sex. Use condoms and avoid high-risk sexual practices to reduce the spread of sexually transmitted infections (STIs). Sexually active women aged 76 and younger should be checked for Chlamydia, which is a common sexually transmitted infection. Older women with new or multiple partners should also be tested for Chlamydia. Testing for other STIs is recommended if you are sexually active and at increased risk.   Osteoporosis is a disease in which the bones lose minerals and strength with aging. This can result in serious bone fractures. The risk of osteoporosis can be identified using a bone density scan. Women ages 36 and over and women at risk for fractures or osteoporosis should discuss screening with their caregivers.  Ask your caregiver whether you should be taking a calcium supplement or vitamin D to reduce the rate of osteoporosis.   Menopause can be associated with physical symptoms and risks. Hormone replacement therapy is available to decrease symptoms and risks. You should talk to your caregiver about whether hormone replacement therapy is right for you.   Use sunscreen with a sun protection factor (SPF) of 30 or greater. Apply sunscreen liberally and repeatedly throughout the day. You should seek shade when your shadow is shorter than you. Protect yourself by wearing long sleeves, pants, a wide-brimmed hat, and sunglasses year round, whenever you are outdoors.   Notify your caregiver of new moles or changes in moles, especially if there is a change in shape or color. Also notify your caregiver if a mole is larger than the size of a pencil eraser.   Stay current with your immunizations.  Document Released: 05/06/2011 Document Revised: 10/10/2011 Document Reviewed: 05/06/2011 [facility] Patient Information 2012 [place], [place].

## 2012-06-18 NOTE — Progress Notes (Signed)
Subjective:    Patient ID: Yvette Ross, female    DOB: 04-02-1976, 36 y.o.   MRN: [id number]  HPI  New pt to me and our practice - here to establish with PCP Also patient is here today for annual physical. Patient feels well overall.  occ low back pain - see ROS No pain at this time - last "spasm" 3 weeks ago, relieved overnight with rest and NSAIDs occ radiation into posterior L thigh Precipitated by overuse (lifting tractor)  Past Medical History  Diagnosis Date  . No pertinent past medical history    Family History  Problem Relation Age of Onset  . Diabetes Father     type 2  . Hypertension Father   . Aneurysm Father   . Cancer Father   . Coronary artery disease Mother     stent  . Hyperlipidemia Mother   . Depression Mother   . Alcohol abuse Mother   . Osteoarthritis Mother   . Stroke Father   . Hyperlipidemia Maternal Grandmother   . Rheum arthritis Paternal Grandmother   . Cancer Maternal Grandfather     lung  . Heart defect Brother    History  Substance Use Topics  . Smoking status: Former Games developer  . Smokeless tobacco: Never Used   Comment: [employer] grad - communication studies and dance; married, lives with spouse and 2 kids  . Alcohol Use: No     Review of Systems Constitutional: Negative for fever or weight change.  Respiratory: Negative for cough and shortness of breath.   Cardiovascular: Negative for chest pain or palpitations.  Gastrointestinal: Negative for abdominal pain, no bowel changes.  Musculoskeletal: Negative for gait problem or joint swelling. Occ low back pain with radiation to LLE, none at this time Skin: Negative for rash.  Neurological: Negative for dizziness or headache.  No other specific complaints in a complete review of systems (except as listed in HPI above).     Objective:   Physical Exam BP 110/68  Pulse 69  Temp 97.3 F (36.3 C) (Oral)  Ht 5\' 3"  (1.6 m)  Wt 135 lb (61.236 kg)  BMI 23.91 kg/m2  SpO2 97% Wt  Readings from Last 3 Encounters:  06/18/12 135 lb (61.236 kg)  06/28/11 169 lb (76.658 kg)  06/28/11 169 lb (76.658 kg)   Constitutional: She appears well-developed and well-nourished. No distress.  HENT: Head: Normocephalic and atraumatic. Ears: B TMs ok, no erythema or effusion; Nose: Nose normal.  Mouth/Throat: Oropharynx is clear and moist. No oropharyngeal exudate.  Eyes: Conjunctivae and EOM are normal. Pupils are equal, round, and reactive to light. No scleral icterus.  Neck: Normal range of motion. Neck supple. No JVD present. No thyromegaly present.  Cardiovascular: Normal rate, regular rhythm and normal heart sounds.  No murmur heard. No BLE edema. Pulmonary/Chest: Effort normal and breath sounds normal. No respiratory distress. She has no wheezes.  Abdominal: Soft. Bowel sounds are normal. She exhibits no distension. There is no tenderness. no masses Musculoskeletal: Back: full range of motion of thoracic and lumbar spine. Non tender to palpation. Negative straight leg raise. DTR's are symmetrically intact. Sensation intact in all dermatomes of the lower extremities. Full strength to manual muscle testing. patient is able to heel toe walk without difficulty and ambulates with antalgic gait. No gross deformities Neurological: She is alert and oriented to person, place, and time. No cranial nerve deficit. Coordination normal.  Skin: inflamed cyst 4mm diameter upper R arm, moles on posterior trunk and  flesh colored tag distal L posterior thigh. Remaining skin is warm and dry. No rash noted. No erythema.  Psychiatric: She has a normal mood and affect. Her behavior is normal. Judgment and thought content normal.   Lab Results  Component Value Date   WBC 13.0* 06/29/2011   HGB 10.6* 06/29/2011   HCT 30.7* 06/29/2011   PLT 134* 06/29/2011        Assessment & Plan:  CPX/v70.0 - Patient has been counseled on age-appropriate routine health concerns for screening and prevention. These are  reviewed and up-to-date. Immunizations are up-to-date or declined. Labs ordered and reviewed.  Change in mole - refer to derm  Periodic LBP - suspect overuse spasm or "bulge" - exam normal today without pain for >2 weeks -advised on [place] exercises and PT, continue core strengthening, NSAIDs prn and call if persisting or worsening radiation symptoms in future

## 2012-08-13 ENCOUNTER — Other Ambulatory Visit: Payer: Self-pay

## 2012-11-26 ENCOUNTER — Ambulatory Visit: Payer: BC Managed Care – PPO | Admitting: Internal Medicine

## 2013-03-30 ENCOUNTER — Other Ambulatory Visit (INDEPENDENT_AMBULATORY_CARE_PROVIDER_SITE_OTHER): Payer: BC Managed Care – PPO

## 2013-03-30 DIAGNOSIS — Z0000 Encounter for general adult medical examination without abnormal findings: Secondary | ICD-10-CM

## 2013-03-30 LAB — HEPATIC FUNCTION PANEL
ALT: 15 U/L (ref 0–35)
AST: 15 U/L (ref 0–37)
Albumin: 4.4 g/dL (ref 3.5–5.2)
Alkaline Phosphatase: 30 U/L — ABNORMAL LOW (ref 39–117)
Bilirubin, Direct: 0.1 mg/dL (ref 0.0–0.3)
Total Protein: 7.2 g/dL (ref 6.0–8.3)

## 2013-03-30 LAB — LIPID PANEL
Cholesterol: 169 mg/dL (ref 0–200)
Triglycerides: 45 mg/dL (ref 0.0–149.0)

## 2013-03-30 LAB — CBC WITH DIFFERENTIAL/PLATELET
Basophils Absolute: 0 10*3/uL (ref 0.0–0.1)
Eosinophils Absolute: 0.1 10*3/uL (ref 0.0–0.7)
HCT: 41.8 % (ref 36.0–46.0)
Lymphs Abs: 1.5 10*3/uL (ref 0.7–4.0)
MCV: 92.1 fl (ref 78.0–100.0)
Monocytes Absolute: 0.3 10*3/uL (ref 0.1–1.0)
Platelets: 264 10*3/uL (ref 150.0–400.0)
RDW: 12.6 % (ref 11.5–14.6)

## 2013-03-30 LAB — URINALYSIS, ROUTINE W REFLEX MICROSCOPIC
Bilirubin Urine: NEGATIVE
Leukocytes, UA: NEGATIVE
Nitrite: NEGATIVE
Total Protein, Urine: NEGATIVE

## 2013-03-30 LAB — BASIC METABOLIC PANEL
BUN: 11 mg/dL (ref 6–23)
Calcium: 9.3 mg/dL (ref 8.4–10.5)
Creatinine, Ser: 0.6 mg/dL (ref 0.4–1.2)

## 2013-03-30 LAB — TSH: TSH: 2.75 u[IU]/mL (ref 0.35–5.50)

## 2013-05-25 ENCOUNTER — Encounter: Payer: Self-pay | Admitting: Internal Medicine

## 2013-05-25 ENCOUNTER — Ambulatory Visit (INDEPENDENT_AMBULATORY_CARE_PROVIDER_SITE_OTHER): Payer: BC Managed Care – PPO | Admitting: Internal Medicine

## 2013-05-25 VITALS — BP 100/62 | HR 69 | Temp 98.4°F | Ht 63.0 in | Wt 137.0 lb

## 2013-05-25 DIAGNOSIS — Z0000 Encounter for general adult medical examination without abnormal findings: Secondary | ICD-10-CM

## 2013-05-25 NOTE — Patient Instructions (Signed)
It was good to see you today. Health Maintenance reviewed - all recommended immunizations and age-appropriate screenings are up-to-date.  Medications reviewed and updated, no changes recommended at this time. Please schedule followup annually for wellness visit and labs, call sooner if problems.  Health Maintenance, Females A healthy lifestyle and preventative care can promote health and wellness.  Maintain regular health, dental, and eye exams.  Eat a healthy diet. Foods like vegetables, fruits, whole grains, low-fat dairy products, and lean protein foods contain the nutrients you need without too many calories. Decrease your intake of foods high in solid fats, added sugars, and salt. Get information about a proper diet from your caregiver, if necessary.  Regular physical exercise is one of the most important things you can do for your health. Most adults should get at least 150 minutes of moderate-intensity exercise (any activity that increases your heart rate and causes you to sweat) each week. In addition, most adults need muscle-strengthening exercises on 2 or more days a week.   Maintain a healthy weight. The body mass index (BMI) is a screening tool to identify possible weight problems. It provides an estimate of body fat based on height and weight. Your caregiver can help determine your BMI, and can help you achieve or maintain a healthy weight. For adults 20 years and older:  A BMI below 18.5 is considered underweight.  A BMI of 18.5 to 24.9 is normal.  A BMI of 25 to 29.9 is considered overweight.  A BMI of 30 and above is considered obese.  Maintain normal blood lipids and cholesterol by exercising and minimizing your intake of saturated fat. Eat a balanced diet with plenty of fruits and vegetables. Blood tests for lipids and cholesterol should begin at age 50 and be repeated every 5 years. If your lipid or cholesterol levels are high, you are over 50, or you are a high risk for  heart disease, you may need your cholesterol levels checked more frequently.Ongoing high lipid and cholesterol levels should be treated with medicines if diet and exercise are not effective.  If you smoke, find out from your caregiver how to quit. If you do not use tobacco, do not start.  If you are pregnant, do not drink alcohol. If you are breastfeeding, be very cautious about drinking alcohol. If you are not pregnant and choose to drink alcohol, do not exceed 1 drink per day. One drink is considered to be 12 ounces (355 mL) of beer, 5 ounces (148 mL) of wine, or 1.5 ounces (44 mL) of liquor.  Avoid use of street drugs. Do not share needles with anyone. Ask for help if you need support or instructions about stopping the use of drugs.  High blood pressure causes heart disease and increases the risk of stroke. Blood pressure should be checked at least every 1 to 2 years. Ongoing high blood pressure should be treated with medicines, if weight loss and exercise are not effective.  If you are 10 to 37 years old, ask your caregiver if you should take aspirin to prevent strokes.  Diabetes screening involves taking a blood sample to check your fasting blood sugar level. This should be done once every 3 years, after age 65, if you are within normal weight and without risk factors for diabetes. Testing should be considered at a younger age or be carried out more frequently if you are overweight and have at least 1 risk factor for diabetes.  Breast cancer screening is essential preventative care  for women. You should practice "breast self-awareness." This means understanding the normal appearance and feel of your breasts and may include breast self-examination. Any changes detected, no matter how small, should be reported to a caregiver. Women in their 74s and 30s should have a clinical breast exam (CBE) by a caregiver as part of a regular health exam every 1 to 3 years. After age 15, women should have a CBE  every year. Starting at age 5, women should consider having a mammogram (breast X-ray) every year. Women who have a family history of breast cancer should talk to their caregiver about genetic screening. Women at a high risk of breast cancer should talk to their caregiver about having an MRI and a mammogram every year.  The Pap test is a screening test for cervical cancer. Women should have a Pap test starting at age 79. Between ages 30 and 34, Pap tests should be repeated every 2 years. Beginning at age 18, you should have a Pap test every 3 years as long as the past 3 Pap tests have been normal. If you had a hysterectomy for a problem that was not cancer or a condition that could lead to cancer, then you no longer need Pap tests. If you are between ages 61 and 51, and you have had normal Pap tests going back 10 years, you no longer need Pap tests. If you have had past treatment for cervical cancer or a condition that could lead to cancer, you need Pap tests and screening for cancer for at least 20 years after your treatment. If Pap tests have been discontinued, risk factors (such as a new sexual partner) need to be reassessed to determine if screening should be resumed. Some women have medical problems that increase the chance of getting cervical cancer. In these cases, your caregiver may recommend more frequent screening and Pap tests.  The human papillomavirus (HPV) test is an additional test that may be used for cervical cancer screening. The HPV test looks for the virus that can cause the cell changes on the cervix. The cells collected during the Pap test can be tested for HPV. The HPV test could be used to screen women aged 110 years and older, and should be used in women of any age who have unclear Pap test results. After the age of 72, women should have HPV testing at the same frequency as a Pap test.  Colorectal cancer can be detected and often prevented. Most routine colorectal cancer screening  begins at the age of 33 and continues through age 19. However, your caregiver may recommend screening at an earlier age if you have risk factors for colon cancer. On a yearly basis, your caregiver may provide home test kits to check for hidden blood in the stool. Use of a small camera at the end of a tube, to directly examine the colon (sigmoidoscopy or colonoscopy), can detect the earliest forms of colorectal cancer. Talk to your caregiver about this at age 37, when routine screening begins. Direct examination of the colon should be repeated every 5 to 10 years through age 49, unless early forms of pre-cancerous polyps or small growths are found.  Hepatitis C blood testing is recommended for all people born from [birth year] through 1965 and any individual with known risks for hepatitis C.  Practice safe sex. Use condoms and avoid high-risk sexual practices to reduce the spread of sexually transmitted infections (STIs). Sexually active women aged 31 and younger should be checked for  Chlamydia, which is a common sexually transmitted infection. Older women with new or multiple partners should also be tested for Chlamydia. Testing for other STIs is recommended if you are sexually active and at increased risk.  Osteoporosis is a disease in which the bones lose minerals and strength with aging. This can result in serious bone fractures. The risk of osteoporosis can be identified using a bone density scan. Women ages 77 and over and women at risk for fractures or osteoporosis should discuss screening with their caregivers. Ask your caregiver whether you should be taking a calcium supplement or vitamin D to reduce the rate of osteoporosis.  Menopause can be associated with physical symptoms and risks. Hormone replacement therapy is available to decrease symptoms and risks. You should talk to your caregiver about whether hormone replacement therapy is right for you.  Use sunscreen with a sun protection factor (SPF) of 30  or greater. Apply sunscreen liberally and repeatedly throughout the day. You should seek shade when your shadow is shorter than you. Protect yourself by wearing long sleeves, pants, a wide-brimmed hat, and sunglasses year round, whenever you are outdoors.  Notify your caregiver of new moles or changes in moles, especially if there is a change in shape or color. Also notify your caregiver if a mole is larger than the size of a pencil eraser.  Stay current with your immunizations. Document Released: 05/06/2011 Document Revised: 01/13/2012 Document Reviewed: 05/06/2011 [facility] Patient Information 2014 [place], [place].

## 2013-05-25 NOTE — Progress Notes (Signed)
Subjective:    Patient ID: Yvette Ross, female    DOB: 1976/03/09, 37 y.o.   MRN: [id number]  HPI  patient is here today for annual physical. Patient feels well overall.   Past Medical History  Diagnosis Date  . No pertinent past medical history    Family History  Problem Relation Age of Onset  . Diabetes Father     type 2  . Hypertension Father   . Aneurysm Father   . Cancer Father   . Coronary artery disease Mother     stent  . Hyperlipidemia Mother   . Depression Mother   . Alcohol abuse Mother   . Osteoarthritis Mother   . Stroke Father   . Hyperlipidemia Maternal Grandmother   . Rheum arthritis Paternal Grandmother   . Cancer Maternal Grandfather     lung  . Heart defect Brother    History  Substance Use Topics  . Smoking status: Former Games developer  . Smokeless tobacco: Never Used     Comment: [employer] grad - communication studies and dance; married, lives with spouse and 2 kids  . Alcohol Use: No     Review of Systems  Constitutional: Negative for fever or weight change.  Respiratory: Negative for cough and shortness of breath.   Cardiovascular: Negative for chest pain or palpitations.  Gastrointestinal: Negative for abdominal pain, no bowel changes.  Musculoskeletal: Negative for gait problem or joint swelling.  Skin: Negative for rash.  Neurological: Negative for dizziness or headache.  No other specific complaints in a complete review of systems (except as listed in HPI above).     Objective:   Physical Exam  BP 100/62  Pulse 69  Temp(Src) 98.4 F (36.9 C) (Oral)  Ht 5\' 3"  (1.6 m)  Wt 137 lb (62.143 kg)  BMI 24.27 kg/m2  SpO2 97% Wt Readings from Last 3 Encounters:  05/25/13 137 lb (62.143 kg)  06/18/12 135 lb (61.236 kg)  06/28/11 169 lb (76.658 kg)   Constitutional: She appears well-developed and well-nourished. No distress.  HENT: Head: Normocephalic and atraumatic. Ears: B TMs ok, no erythema or effusion; Nose: Nose normal.  Mouth/Throat: Oropharynx is clear and moist. No oropharyngeal exudate.  Eyes: Conjunctivae and EOM are normal. Pupils are equal, round, and reactive to light. No scleral icterus.  Neck: Normal range of motion. Neck supple. No JVD present. No thyromegaly present.  Cardiovascular: Normal rate, regular rhythm and normal heart sounds.  No murmur heard. No BLE edema. Pulmonary/Chest: Effort normal and breath sounds normal. No respiratory distress. She has no wheezes.  Abdominal: Soft. Bowel sounds are normal. She exhibits no distension. There is no tenderness. no masses Musculoskeletal: Normal range of motion, no joint effusions. No gross deformities Neurological: She is alert and oriented to person, place, and time. No cranial nerve deficit. Coordination, balance, strength, speech and gait are normal.  Skin: flesh colored nevus on L shoulder blade, 5mm diameter, smaller moles on posterior trunk. Remaining skin is warm and dry. No rash noted. No erythema.  Psychiatric: She has a normal mood and affect. Her behavior is normal. Judgment and thought content normal.   Lab Results  Component Value Date   WBC 4.4* 03/30/2013   HGB 14.3 03/30/2013   HCT 41.8 03/30/2013   PLT 264.0 03/30/2013   GLUCOSE 90 03/30/2013   CHOL 169 03/30/2013   TRIG 45.0 03/30/2013   HDL 71.90 03/30/2013   LDLCALC 88 03/30/2013   ALT 15 03/30/2013   AST 15 03/30/2013  NA 139 03/30/2013   K 4.3 03/30/2013   CL 104 03/30/2013   CREATININE 0.6 03/30/2013   BUN 11 03/30/2013   CO2 29 03/30/2013   TSH 2.75 03/30/2013        Assessment & Plan:   CPX/v70.0 - Patient has been counseled on age-appropriate routine health concerns for screening and prevention. These are reviewed and up-to-date. Immunizations are up-to-date or declined. Labs ordered and reviewed.

## 2013-08-16 ENCOUNTER — Encounter: Payer: Self-pay | Admitting: Internal Medicine

## 2013-08-26 ENCOUNTER — Telehealth: Payer: Self-pay | Admitting: Internal Medicine

## 2013-08-26 NOTE — Telephone Encounter (Signed)
Patient Information:  Caller Name: Jenifer  Phone: [phone]  Patient: Yvette Ross, Yvette Ross  Gender: Female  DOB: Jan 05, 1976  Age: 37 Years  PCP: Rene Paci (Adults only)  Pregnant: No  Office Follow Up:  Does the office need to follow up with this patient?: No  Instructions For The Office: N/A  RN Note:  Home care advice given. Advised to monitor temp and if she is running a fever, she shouldn't be around people. Patient verbalized understanding.  Symptoms  Reason For Call & Symptoms: Calling about vomiting,diarrhea last night and legs aching today. Ate out last night,had salad and sushi. Currently no more vomiting or diarrhea and legs no longer hurt. Pt just wants to know if she has a virus and if she should be around family.  Reviewed Health History In EMR: Yes  Reviewed Medications In EMR: Yes  Reviewed Allergies In EMR: Yes  Reviewed Surgeries / Procedures: Yes  Date of Onset of Symptoms: 08/25/2013  Treatments Tried: Ibuprofen with effect  Treatments Tried Worked: Yes OB / GYN:  LMP: 08/05/2013  Guideline(s) Used:  Leg Pain  Disposition Per Guideline:   Home Care  Reason For Disposition Reached:   Leg pain  Advice Given:  Reassurance - Leg Pain  Usually leg pain is not serious. You have told me that there is no redness, numbness, or swelling.  Causes of leg pain can include a strained muscle, a forgotten minor injury, and tendinitis.  Here is some care advice that should help.  Pain Medicines:  For pain relief, you can take either acetaminophen, ibuprofen, or naproxen.  Acetaminophen (e.g., Tylenol):  Regular Strength Tylenol: Take 650 mg (two 325 mg pills) by mouth every 4-6 hours as needed. Each Regular Strength Tylenol pill has 325 mg of acetaminophen.  Ibuprofen (e.g., Motrin, Advil):  Take 400 mg (two 200 mg pills) by mouth every 6 hours.  Call Back If:  Moderate pain (e.g., limping) lasts more than 3 days  Mild pain lasts more than 7 days  Signs of infection occur (e.g., spreading redness, warmth, fever)  You become worse.  Patient Will Follow Care Advice:  YES

## 2014-01-06 ENCOUNTER — Telehealth: Payer: Self-pay | Admitting: Internal Medicine

## 2014-01-06 MED ORDER — AZITHROMYCIN 250 MG PO TABS: ORAL_TABLET | ORAL | Status: DC

## 2014-01-06 NOTE — Telephone Encounter (Signed)
Patient states that her two daughters were diagnosed with their strep throat by their pediatrician and now she feels as though she has contracted it from them. She wants to know if she can have something prescribed for it without coming in for an OV. States that she does not have a sitter for them and prefers not to have to bring them in with her to an OV. Please advise.

## 2014-01-06 NOTE — Telephone Encounter (Signed)
Patient informed script sent into pharmacy

## 2014-01-06 NOTE — Telephone Encounter (Signed)
Ok this time - done per [employer]

## 2014-08-03 ENCOUNTER — Encounter: Payer: Self-pay | Admitting: Internal Medicine

## 2014-09-05 ENCOUNTER — Encounter: Payer: Self-pay | Admitting: Internal Medicine

## 2014-10-07 ENCOUNTER — Encounter: Payer: Self-pay | Admitting: Internal Medicine

## 2014-10-07 ENCOUNTER — Ambulatory Visit (INDEPENDENT_AMBULATORY_CARE_PROVIDER_SITE_OTHER): Payer: BC Managed Care – PPO | Admitting: Internal Medicine

## 2014-10-07 VITALS — BP 94/60 | HR 79 | Temp 98.2°F | Resp 16 | Ht 63.0 in | Wt 147.0 lb

## 2014-10-07 DIAGNOSIS — J020 Streptococcal pharyngitis: Secondary | ICD-10-CM

## 2014-10-07 MED ORDER — AMOXICILLIN 875 MG PO TABS: 875.0000 mg | ORAL_TABLET | Freq: Two times a day (BID) | ORAL | Status: DC

## 2014-10-07 NOTE — Patient Instructions (Signed)

## 2014-10-07 NOTE — Assessment & Plan Note (Signed)
She has classic s/s RSS is +  Will treat with amoxil

## 2014-10-07 NOTE — Progress Notes (Signed)
Pre visit review using our clinic review tool, if applicable. No additional management support is needed unless otherwise documented below in the visit note. 

## 2014-10-07 NOTE — Progress Notes (Signed)
Subjective:    Patient ID: Yvette Ross, female    DOB: [birth year], 38 y.o.   MRN: [id number]  Sore Throat  This is a new problem. The current episode started in the past 7 days. The problem has been unchanged. Neither side of throat is experiencing more pain than the other. The maximum temperature recorded prior to her arrival was 100.4 - 100.9 F. The fever has been present for less than 1 day. The pain is at a severity of 1/10. The patient is experiencing no pain. Associated symptoms include swollen glands. Pertinent negatives include no abdominal pain, congestion, coughing, diarrhea, drooling, ear discharge, ear pain, headaches, hoarse voice, plugged ear sensation, neck pain, shortness of breath, stridor, trouble swallowing or vomiting. She has had exposure to strep. She has had no exposure to mono. She has tried nothing for the symptoms. The treatment provided no relief.      Review of Systems  Constitutional: Negative.   HENT: Negative.  Negative for congestion, drooling, ear discharge, ear pain, hoarse voice and trouble swallowing.   Eyes: Negative.   Respiratory: Negative.  Negative for cough, shortness of breath and stridor.   Cardiovascular: Negative.  Negative for chest pain, palpitations and leg swelling.  Gastrointestinal: Negative.  Negative for vomiting, abdominal pain and diarrhea.  Endocrine: Negative.   Genitourinary: Negative.   Musculoskeletal: Negative.  Negative for neck pain.  Skin: Negative.  Negative for rash.  Allergic/Immunologic: Negative.   Neurological: Negative.  Negative for headaches.  Hematological: Positive for adenopathy.  Psychiatric/Behavioral: Negative.        Objective:   Physical Exam  Constitutional: She is oriented to person, place, and time. She appears well-developed and well-nourished.  Non-toxic appearance. She does not have a sickly appearance. She does not appear ill. No distress.  HENT:  Head: Normocephalic and atraumatic.    Mouth/Throat: Mucous membranes are normal. Mucous membranes are not pale, not dry and not cyanotic. No oral lesions. No trismus in the jaw. No uvula swelling. Posterior oropharyngeal erythema present. No oropharyngeal exudate, posterior oropharyngeal edema or tonsillar abscesses.  Eyes: Conjunctivae are normal. Right eye exhibits no discharge. Left eye exhibits no discharge. No scleral icterus.  Neck: Normal range of motion. Neck supple. No JVD present. No tracheal deviation present. No thyromegaly present.  Cardiovascular: Normal rate, regular rhythm, normal heart sounds and intact distal pulses.  Exam reveals no gallop and no friction rub.   No murmur heard. Pulmonary/Chest: Effort normal and breath sounds normal. No stridor. No respiratory distress. She has no wheezes. She has no rales. She exhibits no tenderness.  Abdominal: Soft. Bowel sounds are normal. She exhibits no distension and no mass. There is no tenderness. There is no rebound and no guarding.  Musculoskeletal: Normal range of motion. She exhibits no edema or tenderness.  Lymphadenopathy:       Head (right side): No submental, no submandibular, no tonsillar, no preauricular, no posterior auricular and no occipital adenopathy present.       Head (left side): No submental, no submandibular, no tonsillar, no preauricular, no posterior auricular and no occipital adenopathy present.    She has cervical adenopathy.       Right cervical: Superficial cervical adenopathy present. No deep cervical and no posterior cervical adenopathy present.      Left cervical: Superficial cervical adenopathy present. No deep cervical and no posterior cervical adenopathy present.    She has no axillary adenopathy.  Neurological: She is oriented to person, place, and time.  Skin: Skin is warm and dry. No rash noted. She is not diaphoretic. No erythema. No pallor.  Psychiatric: She has a normal mood and affect. Her behavior is normal. Judgment and thought  content normal.  Vitals reviewed.         Assessment & Plan:

## 2015-07-06 ENCOUNTER — Ambulatory Visit (INDEPENDENT_AMBULATORY_CARE_PROVIDER_SITE_OTHER): Payer: BLUE CROSS/BLUE SHIELD | Admitting: Family Medicine

## 2015-07-06 ENCOUNTER — Ambulatory Visit (INDEPENDENT_AMBULATORY_CARE_PROVIDER_SITE_OTHER): Payer: BLUE CROSS/BLUE SHIELD

## 2015-07-06 VITALS — BP 104/58 | HR 56 | Temp 98.9°F | Resp 16 | Ht 63.5 in | Wt 145.0 lb

## 2015-07-06 DIAGNOSIS — J988 Other specified respiratory disorders: Secondary | ICD-10-CM | POA: Diagnosis not present

## 2015-07-06 DIAGNOSIS — R05 Cough: Secondary | ICD-10-CM | POA: Diagnosis not present

## 2015-07-06 DIAGNOSIS — J22 Unspecified acute lower respiratory infection: Secondary | ICD-10-CM

## 2015-07-06 DIAGNOSIS — R0602 Shortness of breath: Secondary | ICD-10-CM | POA: Diagnosis not present

## 2015-07-06 DIAGNOSIS — R059 Cough, unspecified: Secondary | ICD-10-CM

## 2015-07-06 MED ORDER — AZITHROMYCIN 250 MG PO TABS
ORAL_TABLET | ORAL | Status: DC
Start: 2015-07-06 — End: 2016-09-10

## 2015-07-06 MED ORDER — MUCINEX DM 30-600 MG PO TB12: 1.0000 | ORAL_TABLET | Freq: Two times a day (BID) | ORAL | Status: DC | PRN

## 2015-07-06 NOTE — Progress Notes (Signed)
Subjective:  This chart was scribed for Yvette Forts, MD by [facility], medical scribe at [facility].The patient was seen in exam room 14 and the patient's care was started at 6:27 PM.   Patient ID: Yvette Ross, female    DOB: June 11, 1976, 39 y.o.   MRN: [id number]  07/06/2015  Cough and Shortness of Breath  HPI HPI Comments: Yvette Ross is a 39 y.o. female who presents to [facility] complaining of a chest congestion and sob. Associated cough producing a green, yellow mucous. Also has a headache, fatigue diarrhea, and pain with deep breathing. She had fever, chills, body aches, and sore throat, these improved within 24 hours. Daytime/nighttime flu medicine. No wheezing, nausea, and vomiting. Sick contacts, her daughter and family friends have had bronchitis. 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 Going to [employer] this weekend. No chance of pregnancy.  Review of Systems  Constitutional: Positive for fatigue. Negative for fever and chills.  HENT: Positive for congestion. Negative for sore throat.   Respiratory: Positive for cough and shortness of breath. Negative for wheezing.   Gastrointestinal: Positive for diarrhea. Negative for nausea and vomiting.  Musculoskeletal: Negative for myalgias.  Neurological: Positive for headaches.    Past Medical History  Diagnosis Date  . No pertinent past medical history    Past Surgical History  Procedure Laterality Date  . Cesarean section    . Cesarean section  06/28/2011    Procedure: CESAREAN SECTION;  Surgeon: Claiborne Billings A. Fogleman;  Location: [facility];  Service: Gynecology;  Laterality: N/A;  . Tonsillectomy and adenoidectomy  1990   No Known Allergies Current Outpatient Prescriptions  Medication Sig Dispense Refill  . azithromycin (ZITHROMAX) 250 MG tablet Two tablets daily x 1 day the one tablet daily x 4 days 6 tablet 0  . Dextromethorphan-Guaifenesin  (MUCINEX DM) 30-600 MG TB12 Take 1 tablet by mouth 2 (two) times daily as needed. 28 each 0   No current facility-administered medications for this visit.   Social History   Social History  . Marital Status: Married    Spouse Name: N/A  . Number of Children: N/A  . Years of Education: N/A   Occupational History  . Not on file.   Social History Main Topics  . Smoking status: Former Research scientist (life sciences)  . Smokeless tobacco: Never Used     Comment: [employer] grad - communication studies and dance; married, lives with spouse and 2 kids  . Alcohol Use: No  . Drug Use: No  . Sexual Activity: Yes   Other Topics Concern  . Not on file   Social History Narrative       Objective:    BP 104/58 mmHg  Pulse 56  Temp(Src) 98.9 F (37.2 C)  Resp 16  Ht 5' 3.5" (1.613 m)  Wt 145 lb (65.772 kg)  BMI 25.28 kg/m2  SpO2 98%  LMP 06/23/2015 Physical Exam  Constitutional: She is oriented to person, place, and time. She appears well-developed and well-nourished. No distress.  HENT:  Head: Normocephalic and atraumatic.  Right Ear: External ear normal.  Left Ear: External ear normal.  Nose: Nose normal.  Mouth/Throat: Oropharynx is clear and moist.  Eyes: Conjunctivae and EOM are normal. Pupils are equal, round, and reactive to light.  Neck: Normal range of motion. Neck supple. Carotid bruit is not present. No thyromegaly present.  Cardiovascular: Normal rate, regular rhythm, normal heart sounds and intact distal pulses.  Exam  reveals no gallop and no friction rub.   No murmur heard. Pulmonary/Chest: Effort normal and breath sounds normal. She has no wheezes.  Rales in the left lung base.  Abdominal: Soft. Bowel sounds are normal. She exhibits no distension and no mass. There is no tenderness. There is no rebound and no guarding.  Lymphadenopathy:    She has no cervical adenopathy.  Neurological: She is alert and oriented to person, place, and time. No cranial nerve deficit.  Skin: Skin is warm and  dry. No rash noted. She is not diaphoretic. No erythema. No pallor.  Psychiatric: She has a normal mood and affect. Her behavior is normal.    [facility] reading (PRIMARY) by  Dr. Tamala Julian. CXR: NAD      Assessment & Plan:   1. Cough   2. Shortness of breath   3. Lower respiratory infection    -New. -Rx for Zpack, Mucinex DM provided. -Recommend rest, fluids. -RTC for worsening SOB or acute decline.   Meds ordered this encounter  Medications  . azithromycin (ZITHROMAX) 250 MG tablet    Sig: Two tablets daily x 1 day the one tablet daily x 4 days    Dispense:  6 tablet    Refill:  0  . Dextromethorphan-Guaifenesin (MUCINEX DM) 30-600 MG TB12    Sig: Take 1 tablet by mouth 2 (two) times daily as needed.    Dispense:  28 each    Refill:  0   Orders Placed This Encounter  Procedures  . DG Chest 2 View    Standing Status: Future     Number of Occurrences: 1     Standing Expiration Date: 07/05/2016    Order Specific Question:  Reason for Exam (SYMPTOM  OR DIAGNOSIS REQUIRED)    Answer:  cough, SOB    Order Specific Question:  Is the patient pregnant?    Answer:  No    Order Specific Question:  Preferred imaging location?    Answer:  External    No Follow-up on file.  I personally performed the services described in this documentation, which was scribed in my presence. The recorded information has been reviewed and considered.  Sache Sane Elayne Guerin, M.D. [facility] [address] [place], [place]  [address] [phone] phone [phone] fax

## 2015-07-06 NOTE — Patient Instructions (Signed)

## 2015-07-07 ENCOUNTER — Ambulatory Visit: Payer: Self-pay | Admitting: Internal Medicine

## 2016-01-13 IMAGING — CR DG CHEST 2V
2 series · 2 of 2 positions shown · non-contrast
Comparison: None.

CLINICAL DATA: Chest congestion and productive cough. Fever and
chills. Symptoms for the past 24 hours.

EXAM:
CHEST  2 VIEW

[PA]
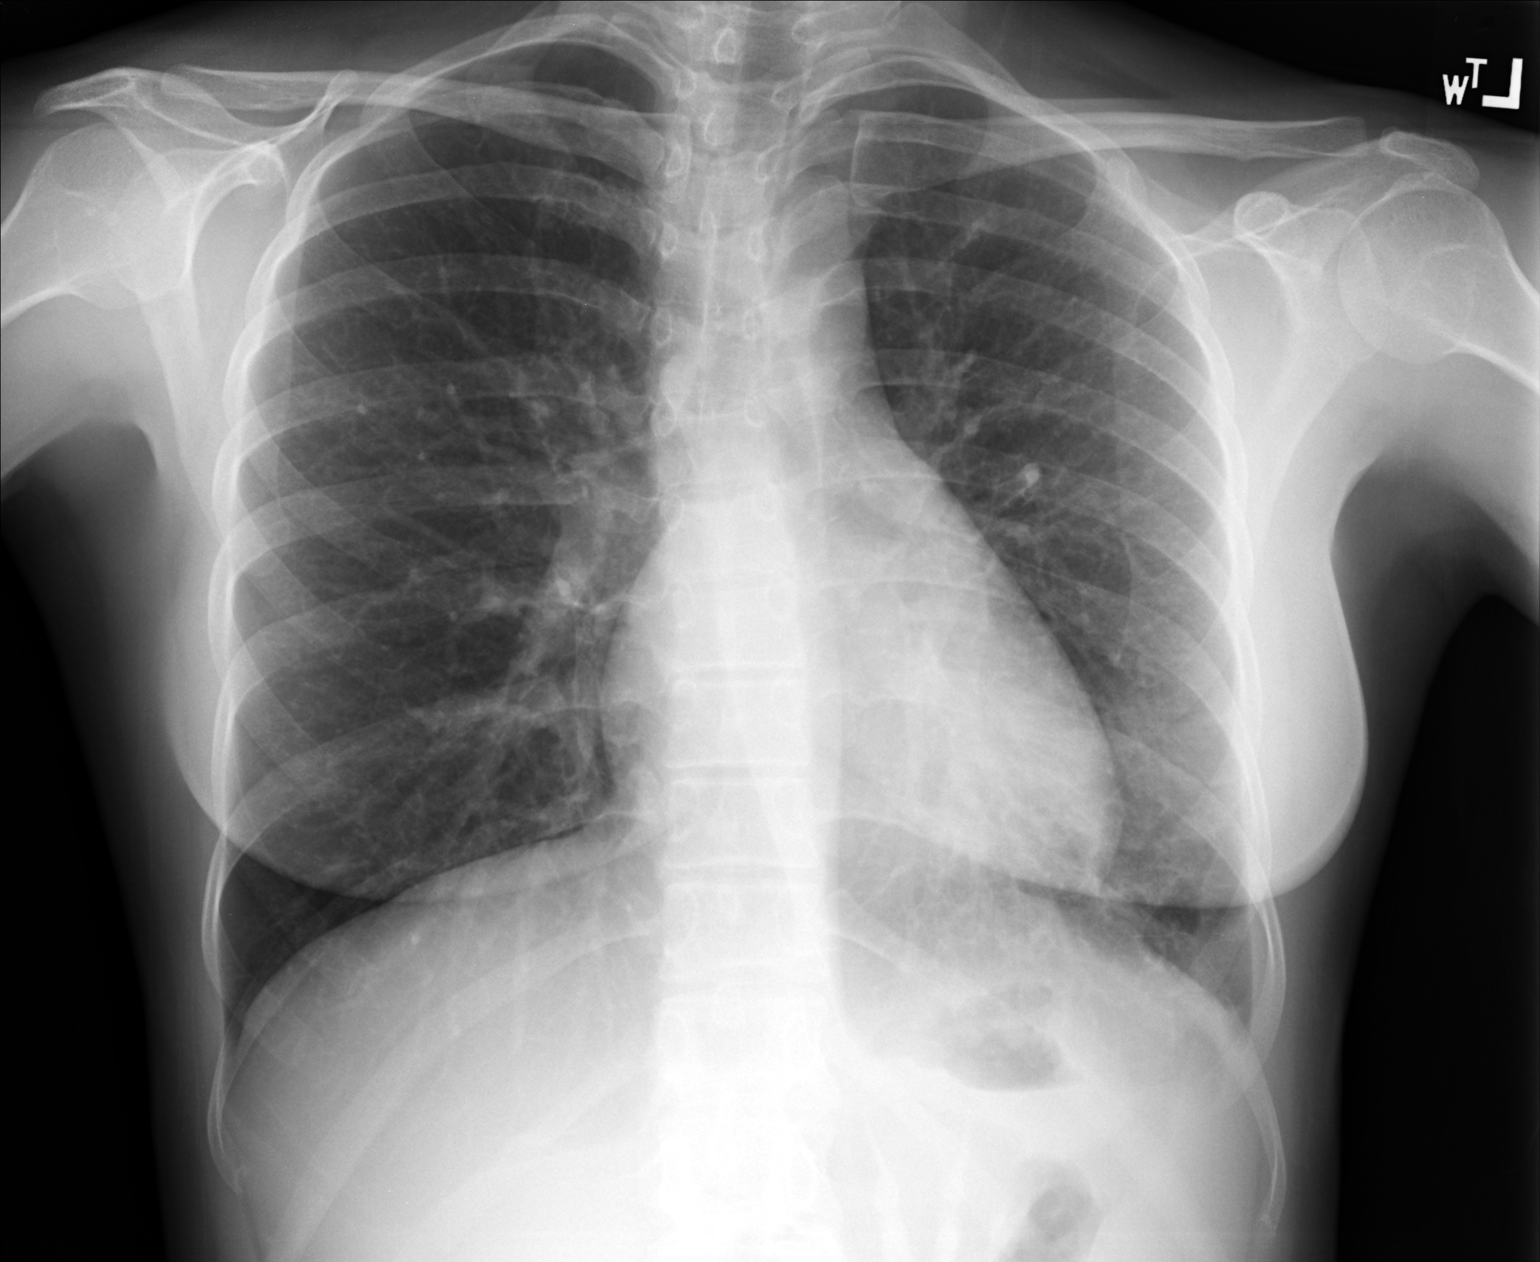

[lateral]
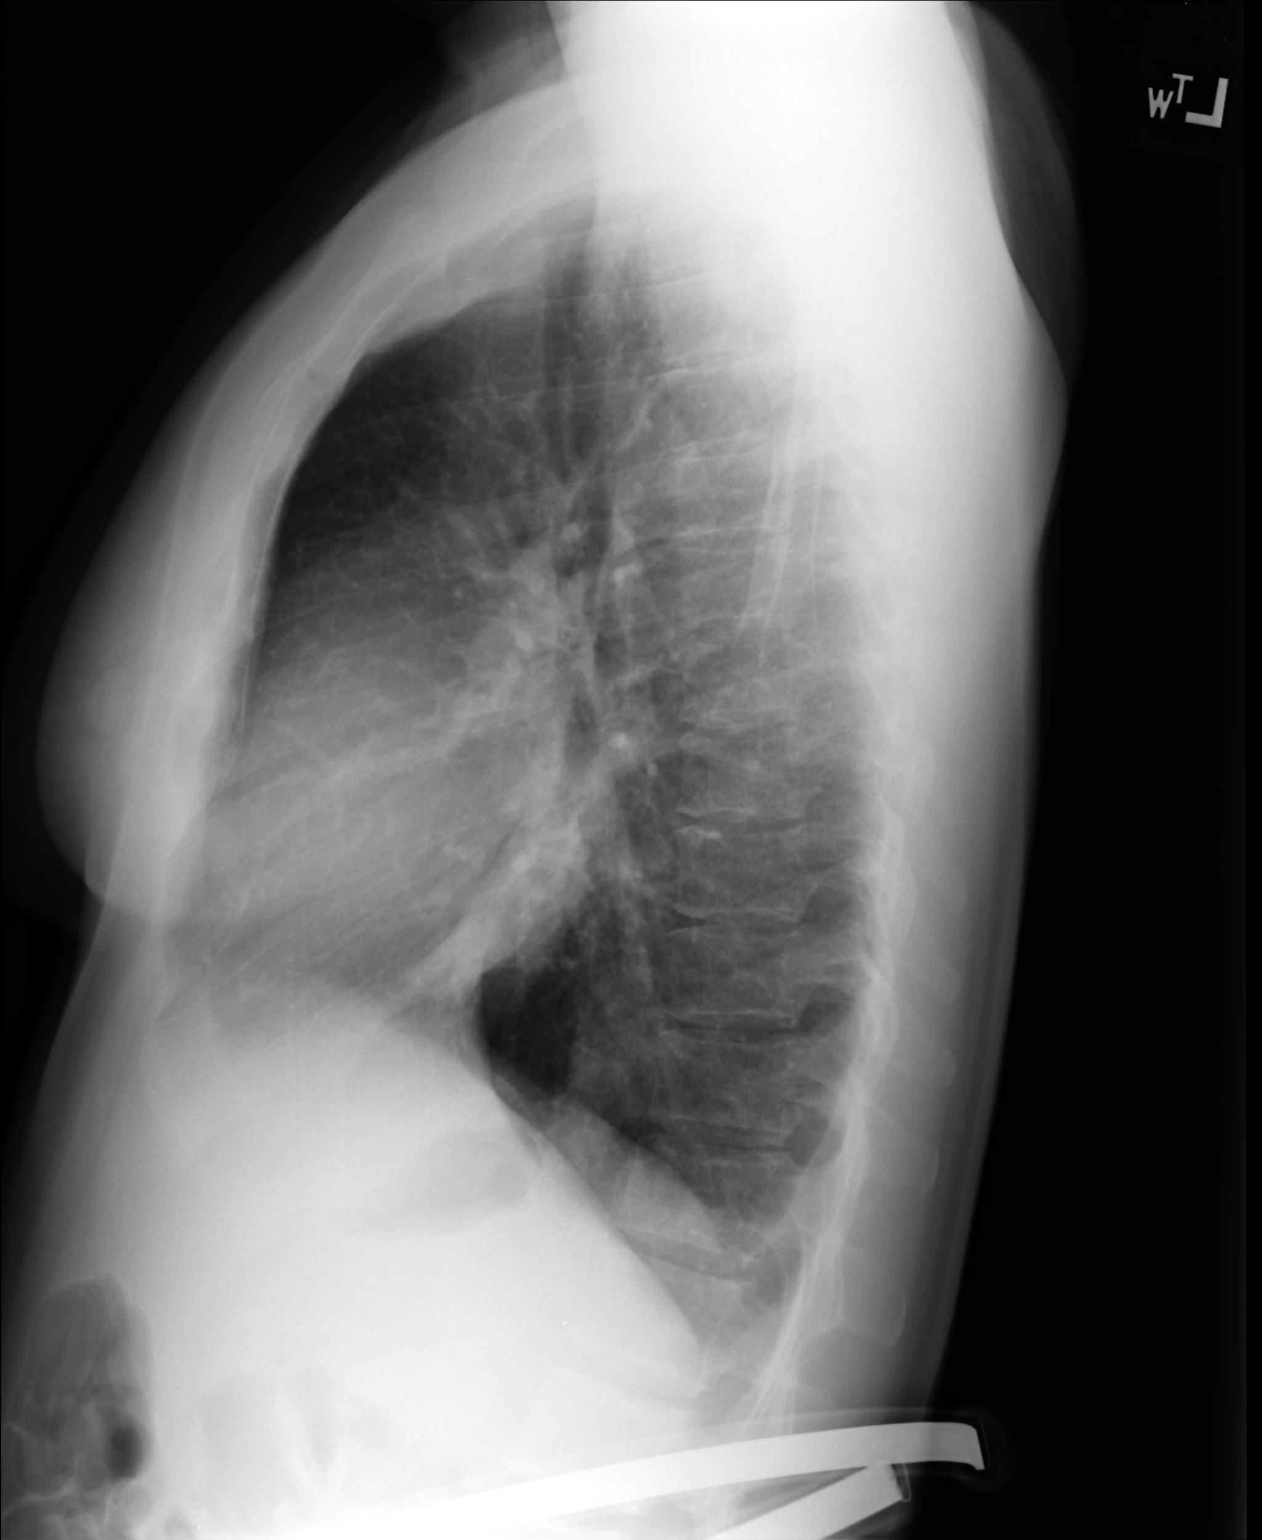

[2 of 2 positions shown; findings below may reference images not displayed]

FINDINGS: There is left lower lobe airspace disease with obscuration of the
medial aspect of the left hemidiaphragm. The right lung is clear.
Heart size is normal. No pneumothorax or pleural effusion.
IMPRESSION: Left lower lobe airspace disease compatible with pneumonia or
atelectasis. Recommend followup plain films to clearing.

## 2016-09-10 ENCOUNTER — Ambulatory Visit (INDEPENDENT_AMBULATORY_CARE_PROVIDER_SITE_OTHER): Payer: BLUE CROSS/BLUE SHIELD | Admitting: Family Medicine

## 2016-09-10 VITALS — BP 122/72 | HR 68 | Temp 98.1°F | Resp 17 | Ht 63.5 in | Wt 146.0 lb

## 2016-09-10 DIAGNOSIS — J209 Acute bronchitis, unspecified: Secondary | ICD-10-CM

## 2016-09-10 MED ORDER — BENZONATATE 100 MG PO CAPS: 100.0000 mg | ORAL_CAPSULE | Freq: Three times a day (TID) | ORAL | 0 refills | Status: DC | PRN

## 2016-09-10 MED ORDER — FLUTICASONE PROPIONATE 50 MCG/ACT NA SUSP: 1.0000 | Freq: Two times a day (BID) | NASAL | 2 refills | Status: DC

## 2016-09-10 MED ORDER — AZITHROMYCIN 250 MG PO TABS: ORAL_TABLET | ORAL | 0 refills | Status: DC

## 2016-09-10 NOTE — Patient Instructions (Addendum)
Take flonase one spray twice a day As your symptoms improve you can do this once a day  Your azithromycin should be taken 2 tablets on day one then one tablet daily    IF you received an x-ray today, you will receive an invoice from [facility]. Please contact [facility] at [phone] with questions or concerns regarding your invoice.   IF you received labwork today, you will receive an invoice from [employer]. Please contact [facility] at [phone] with questions or concerns regarding your invoice.   Our billing staff will not be able to assist you with questions regarding bills from these companies.  You will be contacted with the lab results as soon as they are available. The fastest way to get your results is to activate your My Chart account. Instructions are located on the last page of this paperwork. If you have not heard from [place] regarding the results in 2 weeks, please contact this office.      Acute Bronchitis Bronchitis is inflammation of the airways that extend from the windpipe into the lungs (bronchi). The inflammation often causes mucus to develop. This leads to a cough, which is the most common symptom of bronchitis.  In acute bronchitis, the condition usually develops suddenly and goes away over time, usually in a couple weeks. Smoking, allergies, and asthma can make bronchitis worse. Repeated episodes of bronchitis may cause further lung problems.  CAUSES Acute bronchitis is most often caused by the same virus that causes a cold. The virus can spread from person to person (contagious) through coughing, sneezing, and touching contaminated objects. SIGNS AND SYMPTOMS   Cough.   Fever.   Coughing up mucus.   Body aches.   Chest congestion.   Chills.   Shortness of breath.   Sore throat.  DIAGNOSIS  Acute bronchitis is usually diagnosed through a physical exam. Your health care provider will also ask you  questions about your medical history. Tests, such as chest X-rays, are sometimes done to rule out other conditions.  TREATMENT  Acute bronchitis usually goes away in a couple weeks. Oftentimes, no medical treatment is necessary. Medicines are sometimes given for relief of fever or cough. Antibiotic medicines are usually not needed but may be prescribed in certain situations. In some cases, an inhaler may be recommended to help reduce shortness of breath and control the cough. A cool mist vaporizer may also be used to help thin bronchial secretions and make it easier to clear the chest.  HOME CARE INSTRUCTIONS  Get plenty of rest.   Drink enough fluids to keep your urine clear or pale yellow (unless you have a medical condition that requires fluid restriction). Increasing fluids may help thin your respiratory secretions (sputum) and reduce chest congestion, and it will prevent dehydration.   Take medicines only as directed by your health care provider.  If you were prescribed an antibiotic medicine, finish it all even if you start to feel better.  Avoid smoking and secondhand smoke. Exposure to cigarette smoke or irritating chemicals will make bronchitis worse. If you are a smoker, consider using nicotine gum or skin patches to help control withdrawal symptoms. Quitting smoking will help your lungs heal faster.   Reduce the chances of another bout of acute bronchitis by washing your hands frequently, avoiding people with cold symptoms, and trying not to touch your hands to your mouth, nose, or eyes.   Keep all follow-up visits as directed by your health care provider.  SEEK MEDICAL CARE IF: Your symptoms do not improve after 1 week of treatment.  SEEK IMMEDIATE MEDICAL CARE IF:  You develop an increased fever or chills.   You have chest pain.   You have severe shortness of breath.  You have bloody sputum.   You develop dehydration.  You faint or repeatedly feel like you are  going to pass out.  You develop repeated vomiting.  You develop a severe headache. MAKE SURE YOU:   Understand these instructions.  Will watch your condition.  Will get help right away if you are not doing well or get worse.   This information is not intended to replace advice given to you by your health care provider. Make sure you discuss any questions you have with your health care provider.   Document Released: 11/28/2004 Document Revised: 11/11/2014 Document Reviewed: 04/13/2013 Elsevier Interactive Patient Education [employer].

## 2016-09-10 NOTE — Progress Notes (Signed)
Chief Complaint  Patient presents with  . URI  . Cough    HPI  Upper Respiratory Infection: Patient complains of symptoms of Yvette Ross URI. Symptoms include congestion and cough. Onset of symptoms was 4 weeks ago, gradually improving since that time.  She reports that the rattling productive cough has lingered even though her rhinorrhea and facial pain have improved. She also c/o congestion, facial pain, nasal congestion, productive cough with  white colored sputum and sinus pressure for the past 2 weeks .  She is drinking plenty of fluids. Evaluation to date: none. Treatment to date: cough suppressants (mucinex).   She denies asthma She is Yvette Ross nonsmoker   Past Medical History:  Diagnosis Date  . No pertinent past medical history     Current Outpatient Prescriptions  Medication Sig Dispense Refill  . Dextromethorphan-Guaifenesin (MUCINEX DM) 30-600 MG TB12 Take 1 tablet by mouth 2 (two) times daily as needed. 28 each 0  . azithromycin (ZITHROMAX) 250 MG tablet Two tablets daily x 1 day the one tablet daily x 4 days 6 tablet 0  . benzonatate (TESSALON) 100 MG capsule Take 1 capsule (100 mg total) by mouth 3 (three) times daily as needed for cough. 20 capsule 0  . fluticasone (FLONASE) 50 MCG/ACT nasal spray Place 1 spray into both nostrils 2 (two) times daily. 16 g 2   No current facility-administered medications for this visit.     Allergies: No Known Allergies  Past Surgical History:  Procedure Laterality Date  . CESAREAN SECTION    . CESAREAN SECTION  06/28/2011   Procedure: CESAREAN SECTION;  Surgeon: Yvette Ross;  Location: [facility];  Service: Gynecology;  Laterality: N/Yvette Ross;  . TONSILLECTOMY AND ADENOIDECTOMY  1990    Social History   Social History  . Marital status: Married    Spouse name: N/Yvette Ross  . Number of children: N/Yvette Ross  . Years of education: N/Yvette Ross   Social History Main Topics  . Smoking status: Former Research scientist (life sciences)  . Smokeless tobacco: Never Used     Comment: [employer] grad -  communication studies and dance; married, lives with spouse and 2 kids  . Alcohol use No  . Drug use: No  . Sexual activity: Yes   Other Topics Concern  . None   Social History Narrative  . None    ROS  Objective: Vitals:   09/10/16 1048  BP: 122/72  Pulse: 68  Resp: 17  Temp: 98.1 F (36.7 C)  TempSrc: Oral  SpO2: 96%  Weight: 146 lb (66.2 kg)  Height: 5' 3.5" (1.613 m)    Physical Exam General: alert, oriented, in NAD Head: normocephalic, atraumatic, no sinus tenderness Eyes: EOM intact, no scleral icterus or conjunctival injection Ears: TM clear bilaterally Throat: no pharyngeal exudate or erythema Lymph: no posterior auricular, submental or cervical lymph adenopathy Heart: normal rate, normal sinus rhythm, no murmurs Lungs: upper lobe bilateral wheezing   Assessment and Plan Yvette Ross was seen today for uri and cough.  Diagnoses and all orders for this visit:  Bronchospasm with bronchitis, acute - advised zpak flonase for congestion Tessalon to cough  -     fluticasone (FLONASE) 50 MCG/ACT nasal spray; Place 1 spray into both nostrils 2 (two) times daily. -     benzonatate (TESSALON) 100 MG capsule; Take 1 capsule (100 mg total) by mouth 3 (three) times daily as needed for cough. -     azithromycin (ZITHROMAX) 250 MG tablet; Two tablets daily x 1 day the one tablet  daily x 4 days     Yvette Ross Yvette Ross Yvette Ross

## 2016-10-11 ENCOUNTER — Encounter: Payer: BLUE CROSS/BLUE SHIELD | Admitting: Physician Assistant

## 2016-10-11 NOTE — Progress Notes (Signed)
Left without being seen.   This encounter was created in error - please disregard.

## 2016-10-11 NOTE — Patient Instructions (Signed)
     IF you received an x-ray today, you will receive an invoice from [facility]. Please contact [facility] at [phone] with questions or concerns regarding your invoice.   IF you received labwork today, you will receive an invoice from [facility]. Please contact [facility] at [phone] with questions or concerns regarding your invoice.   Our billing staff will not be able to assist you with questions regarding bills from these companies.  You will be contacted with the lab results as soon as they are available. The fastest way to get your results is to activate your My Chart account. Instructions are located on the last page of this paperwork. If you have not heard from us regarding the results in 2 weeks, please contact this office.      

## 2019-08-20 ENCOUNTER — Other Ambulatory Visit: Payer: Self-pay

## 2019-08-20 DIAGNOSIS — Z20822 Contact with and (suspected) exposure to covid-19: Secondary | ICD-10-CM

## 2019-08-21 LAB — NOVEL CORONAVIRUS, NAA: SARS-CoV-2, NAA: NOT DETECTED

## 2020-01-05 ENCOUNTER — Ambulatory Visit: Payer: Self-pay | Attending: Internal Medicine

## 2020-01-05 DIAGNOSIS — Z20822 Contact with and (suspected) exposure to covid-19: Secondary | ICD-10-CM | POA: Insufficient documentation

## 2020-01-06 LAB — NOVEL CORONAVIRUS, NAA: SARS-CoV-2, NAA: NOT DETECTED

## 2021-09-25 ENCOUNTER — Encounter: Payer: Self-pay | Admitting: Gastroenterology

## 2021-11-22 ENCOUNTER — Other Ambulatory Visit: Payer: Self-pay

## 2021-11-22 ENCOUNTER — Ambulatory Visit (AMBULATORY_SURGERY_CENTER): Payer: BC Managed Care – PPO | Admitting: *Deleted

## 2021-11-22 VITALS — Ht 63.5 in | Wt 145.0 lb

## 2021-11-22 DIAGNOSIS — Z1211 Encounter for screening for malignant neoplasm of colon: Secondary | ICD-10-CM

## 2021-11-22 MED ORDER — NA SULFATE-K SULFATE-MG SULF 17.5-3.13-1.6 GM/177ML PO SOLN: 1.0000 | Freq: Once | ORAL | 0 refills | Status: AC

## 2021-11-22 NOTE — Progress Notes (Signed)
No egg or soy allergy known to patient  No issues known to pt with past sedation with any surgeries or procedures Patient denies past intubation  No FH of Malignant Hyperthermia Pt is not on diet pills Pt is not on  home 02  Pt is not on blood thinners  Pt denies issues with constipation  No A fib or A flutter  Pt is fully vaccinated  for Covid   NO PA's for preps discussed with pt In PV today  Discussed with pt there will be an out-of-pocket cost for prep and that varies from $0 to 70 +  dollars - pt verbalized understanding   Due to the COVID-19 pandemic we are asking patients to follow certain guidelines in PV and the [place]   Pt aware of COVID protocols and [facility] guidelines   PV completed over the phone. Pt verified name, DOB, address and insurance during PV today.  Pt mailed instruction packet with copy of consent form to read and not return, and instructions.  Pt encouraged to call with questions or issues.  If pt has My chart, procedure instructions sent via My Chart

## 2021-11-29 ENCOUNTER — Encounter: Payer: Self-pay | Admitting: Gastroenterology

## 2021-12-06 ENCOUNTER — Encounter: Payer: Self-pay | Admitting: Gastroenterology

## 2021-12-06 ENCOUNTER — Ambulatory Visit (AMBULATORY_SURGERY_CENTER): Payer: BC Managed Care – PPO | Admitting: Gastroenterology

## 2021-12-06 VITALS — BP 106/64 | HR 60 | Temp 98.9°F | Resp 13 | Ht 63.0 in | Wt 145.0 lb

## 2021-12-06 DIAGNOSIS — K635 Polyp of colon: Secondary | ICD-10-CM

## 2021-12-06 DIAGNOSIS — Z1211 Encounter for screening for malignant neoplasm of colon: Secondary | ICD-10-CM

## 2021-12-06 DIAGNOSIS — D122 Benign neoplasm of ascending colon: Secondary | ICD-10-CM

## 2021-12-06 DIAGNOSIS — D123 Benign neoplasm of transverse colon: Secondary | ICD-10-CM

## 2021-12-06 MED ORDER — SODIUM CHLORIDE 0.9 % IV SOLN: 500.0000 mL | Freq: Once | INTRAVENOUS | Status: DC

## 2021-12-06 NOTE — Progress Notes (Signed)
D.T. vital signs. °

## 2021-12-06 NOTE — Progress Notes (Signed)
[facility] History and Physical   Primary Care Physician:  Forrest Moron, MD   Reason for Procedure:   Colon cancer screening  Plan:    Screening colonoscopy     HPI: Yvette Ross is a 46 y.o. female undergoing initial average risk screening colonoscopy.  She has no family history of colon cancer and no chronic GI symptoms.    Past Medical History:  Diagnosis Date   Healthy adult on routine physical examination    No pertinent past medical history     Past Surgical History:  Procedure Laterality Date   CESAREAN SECTION  2010   CESAREAN SECTION  06/28/2011   Procedure: CESAREAN SECTION;  Surgeon: Claiborne Billings A. Fogleman;  Location: [facility];  Service: Gynecology;  Laterality: N/A;   dental procedure      with gas- awake and very relaxed   TONSILLECTOMY AND ADENOIDECTOMY  11/04/1988    Prior to Admission medications   Medication Sig Start Date End Date Taking? Authorizing Provider  VYLEESI 1.75 MG/0.3ML SOAJ Inject into the skin. Patient not taking: Reported on 11/22/2021 07/30/21   [provider]    Current Outpatient Medications  Medication Sig Dispense Refill   VYLEESI 1.75 MG/0.3ML SOAJ Inject into the skin. (Patient not taking: Reported on 11/22/2021)     Current Facility-Administered Medications  Medication Dose Route Frequency Provider Last Rate Last Admin   0.9 %  sodium chloride infusion  500 mL Intravenous Once Daryel November, MD        Allergies as of 12/06/2021   (No Known Allergies)    Family History  Problem Relation Age of Onset   Coronary artery disease Mother        stent   Hyperlipidemia Mother    Depression Mother    Alcohol abuse Mother    Osteoarthritis Mother    Diabetes Father        type 2   Hypertension Father    Aneurysm Father    Cancer Father    Stroke Father    Heart defect Brother    Hyperlipidemia Maternal Grandmother    Cancer Maternal Grandfather        lung   Rheum arthritis Paternal  Grandmother    Colon cancer Neg Hx    Colon polyps Neg Hx    Esophageal cancer Neg Hx    Rectal cancer Neg Hx    Stomach cancer Neg Hx     Social History   Socioeconomic History   Marital status: Married    Spouse name: Not on file   Number of children: Not on file   Years of education: Not on file   Highest education level: Not on file  Occupational History   Not on file  Tobacco Use   Smoking status: Never   Smokeless tobacco: Never   Tobacco comments:    [employer] grad - communication studies and dance; married, lives with spouse and 2 kids  Substance and Sexual Activity   Alcohol use: Yes    Comment: daily to social   Drug use: No   Sexual activity: Yes  Other Topics Concern   Not on file  Social History Narrative   Not on file   Social Determinants of Health   Financial Resource Strain: Not on file  Food Insecurity: Not on file  Transportation Needs: Not on file  Physical Activity: Not on file  Stress: Not on file  Social Connections: Not on file  Intimate Partner Violence: Not on  file    Review of Systems:  All other review of systems negative except as mentioned in the HPI.  Physical Exam: Vital signs BP (!) 93/53    Pulse 65    Temp 98.9 F (37.2 C)    Ht 5\' 3"  (1.6 m)    Wt 145 lb (65.8 kg)    LMP 11/12/2021    SpO2 98%    BMI 25.69 kg/m   General:   Alert,  Well-developed, well-nourished, pleasant and cooperative in NAD Airway:  Mallampati 2 Lungs:  Clear throughout to auscultation.   Heart:  Regular rate and rhythm; no murmurs, clicks, rubs,  or gallops. Abdomen:  Soft, nontender and nondistended. Normal bowel sounds.   Neuro/Psych:  Normal mood and affect. A and O x 3   Hendrixx Severin E. Candis Schatz, MD [facility]

## 2021-12-06 NOTE — Progress Notes (Signed)
Called to room to assist during endoscopic procedure.  Patient ID and intended procedure confirmed with present staff. Received instructions for my participation in the procedure from the performing physician.  

## 2021-12-06 NOTE — Op Note (Signed)
[facility] Patient Name: Yvette Ross Procedure Date: 12/06/2021 9:48 AM MRN: [id number] Endoscopist: Nicki Reaper E. Candis Schatz , MD Age: 46 Referring MD:  Date of Birth: 01-Apr-1976 Gender: Female Account #: [id number] Procedure:                Colonoscopy Indications:              Screening for colorectal malignant neoplasm, This                            is the patient's first colonoscopy Medicines:                Monitored Anesthesia Care Procedure:                Pre-Anesthesia Assessment:                           - Prior to the procedure, a History and Physical                            was performed, and patient medications and                            allergies were reviewed. The patient's tolerance of                            previous anesthesia was also reviewed. The risks                            and benefits of the procedure and the sedation                            options and risks were discussed with the patient.                            All questions were answered, and informed consent                            was obtained. Prior Anticoagulants: The patient has                            taken no previous anticoagulant or antiplatelet                            agents. ASA Grade Assessment: I - A normal, healthy                            patient. After reviewing the risks and benefits,                            the patient was deemed in satisfactory condition to                            undergo the procedure.  After obtaining informed consent, the colonoscope                            was passed under direct vision. Throughout the                            procedure, the patient's blood pressure, pulse, and                            oxygen saturations were monitored continuously. The                            CF [id number] #[id number] was introduced through the anus                            and advanced to the the  terminal ileum, with                            identification of the appendiceal orifice and IC                            valve. The colonoscopy was performed without                            difficulty. The patient tolerated the procedure                            well. The quality of the bowel preparation was                            adequate. The terminal ileum, ileocecal valve,                            appendiceal orifice, and rectum were photographed.                            The bowel preparation used was SUPREP via split                            dose instruction. Scope In: 9:54:13 AM Scope Out: 10:16:51 AM Scope Withdrawal Time: 0 hours 17 minutes 50 seconds  Total Procedure Duration: 0 hours 22 minutes 38 seconds  Findings:                 Skin tags were found on perianal exam.                           The digital rectal exam was normal. Pertinent                            negatives include normal sphincter tone and no                            palpable rectal lesions.  Three flat polyps were found in the ascending                            colon. The polyps were 4 to 8 mm in size. These                            polyps were removed with a cold snare. Resection                            and retrieval were complete. Estimated blood loss                            was minimal.                           A 5 mm polyp was found in the distal transverse                            colon. The polyp was sessile. The polyp was removed                            with a cold snare. Resection and retrieval were                            complete. Estimated blood loss was minimal.                           The exam was otherwise normal throughout the                            examined colon.                           The terminal ileum appeared normal.                           The retroflexed view of the distal rectum and anal                             verge was normal and showed no anal or rectal                            abnormalities. Complications:            No immediate complications. Estimated Blood Loss:     Estimated blood loss was minimal. Impression:               - Perianal skin tags found on perianal exam.                           - Three 4 to 8 mm polyps in the ascending colon,                            removed with a cold snare. Resected and retrieved.                           -  One 5 mm polyp in the distal transverse colon,                            removed with a cold snare. Resected and retrieved.                           - The examined portion of the ileum was normal.                           - The distal rectum and anal verge are normal on                            retroflexion view. Recommendation:           - Patient has a contact number available for                            emergencies. The signs and symptoms of potential                            delayed complications were discussed with the                            patient. Return to normal activities tomorrow.                            Written discharge instructions were provided to the                            patient.                           - Resume previous diet.                           - Continue present medications.                           - Await pathology results.                           - Repeat colonoscopy (date not yet determined) for                            surveillance based on pathology results. Lawarence Meek E. Candis Schatz, MD 12/06/2021 10:25:24 AM This report has been signed electronically.

## 2021-12-06 NOTE — Progress Notes (Signed)
Pt awake, report to RN, [facility]  °

## 2021-12-06 NOTE — Progress Notes (Signed)
Pt's states no medical or surgical changes since previsit or office visit. 

## 2021-12-06 NOTE — Patient Instructions (Signed)
Resume previous diet and medications. °Awaiting pathology results. Repeat Colonoscopy date to be determined based on pathology results. ° °YOU HAD AN ENDOSCOPIC PROCEDURE TODAY AT THE  ENDOSCOPY CENTER:   Refer to the procedure report that was given to you for any specific questions about what was found during the examination.  If the procedure report does not answer your questions, please call your gastroenterologist to clarify.  If you requested that your care partner not be given the details of your procedure findings, then the procedure report has been included in a sealed envelope for you to review at your convenience later. ° °YOU SHOULD EXPECT: Some feelings of bloating in the abdomen. Passage of more gas than usual.  Walking can help get rid of the air that was put into your GI tract during the procedure and reduce the bloating. If you had a lower endoscopy (such as a colonoscopy or flexible sigmoidoscopy) you may notice spotting of blood in your stool or on the toilet paper. If you underwent a bowel prep for your procedure, you may not have a normal bowel movement for a few days. ° °Please Note:  You might notice some irritation and congestion in your nose or some drainage.  This is from the oxygen used during your procedure.  There is no need for concern and it should clear up in a day or so. ° °SYMPTOMS TO REPORT IMMEDIATELY: ° °Following lower endoscopy (colonoscopy or flexible sigmoidoscopy): ° Excessive amounts of blood in the stool ° Significant tenderness or worsening of abdominal pains ° Swelling of the abdomen that is new, acute ° Fever of 100°F or higher ° °For urgent or emergent issues, a gastroenterologist can be reached at any hour by calling [phone]. °Do not use MyChart messaging for urgent concerns.  ° ° °DIET:  We do recommend a small meal at first, but then you may proceed to your regular diet.  Drink plenty of fluids but you should avoid alcoholic beverages for 24  hours. ° °ACTIVITY:  You should plan to take it easy for the rest of today and you should NOT DRIVE or use heavy machinery until tomorrow (because of the sedation medicines used during the test).   ° °FOLLOW UP: °Our staff will call the number listed on your records 48-72 hours following your procedure to check on you and address any questions or concerns that you may have regarding the information given to you following your procedure. If we do not reach you, we will leave a message.  We will attempt to reach you two times.  During this call, we will ask if you have developed any symptoms of COVID 19. If you develop any symptoms (ie: fever, flu-like symptoms, shortness of breath, cough etc.) before then, please call [phone].  If you test positive for Covid 19 in the 2 weeks post procedure, please call and report this information to us.   ° °If any biopsies were taken you will be contacted by phone or by letter within the next 1-3 weeks.  Please call us at [phone] if you have not heard about the biopsies in 3 weeks.  ° ° °SIGNATURES/CONFIDENTIALITY: °You and/or your care partner have signed paperwork which will be entered into your electronic medical record.  These signatures attest to the fact that that the information above on your After Visit Summary has been reviewed and is understood.  Full responsibility of the confidentiality of this discharge information lies with you and/or your care-partner.  °

## 2021-12-10 ENCOUNTER — Telehealth: Payer: Self-pay | Admitting: *Deleted

## 2021-12-10 NOTE — Telephone Encounter (Signed)
°  Follow up Call-  Call back number 12/06/2021  Post procedure Call Back phone  # [phone]  Permission to leave phone message Yes  Some recent data might be hidden     Patient questions: Message left to call us if necessary.

## 2021-12-10 NOTE — Telephone Encounter (Signed)
Left message mon f/u call

## 2021-12-13 NOTE — Progress Notes (Signed)
Yvette Ross,   The polyps that I removed during your recent procedure were completely benign but were proven to be "pre-cancerous" polyps that MAY have grown into cancers if they had not been removed.  Studies shows that at least 20% of women over age 46 and 30% of men over age 50 have pre-cancerous polyps.  Based on current nationally recognized surveillance guidelines, I recommend that you have a repeat colonoscopy in 5 years.   If you develop any new rectal bleeding, abdominal pain or significant bowel habit changes, please contact me before then.

## 2022-06-04 ENCOUNTER — Ambulatory Visit (INDEPENDENT_AMBULATORY_CARE_PROVIDER_SITE_OTHER): Payer: BC Managed Care – PPO | Admitting: Plastic Surgery

## 2022-06-04 ENCOUNTER — Encounter: Payer: Self-pay | Admitting: Plastic Surgery

## 2022-06-04 DIAGNOSIS — L988 Other specified disorders of the skin and subcutaneous tissue: Secondary | ICD-10-CM | POA: Diagnosis not present

## 2022-06-04 DIAGNOSIS — L819 Disorder of pigmentation, unspecified: Secondary | ICD-10-CM | POA: Insufficient documentation

## 2022-06-04 DIAGNOSIS — Z808 Family history of malignant neoplasm of other organs or systems: Secondary | ICD-10-CM | POA: Diagnosis not present

## 2022-06-04 NOTE — Progress Notes (Signed)
     Patient ID: Yvette Ross, female    DOB: Jul 23, 1976, 46 y.o.   MRN: [id number]   Chief Complaint  Patient presents with   Advice Only   Skin Problem    The patient is a 46 year old female here for evaluation of a skin lesion on her face.  She has not had skin cancer before.  Her dad has had basal cells in the past.  She has a hyperpigmented lesion on the right side of her nose.  Approximately 4 mm in size.  It is not raised.  She is concerned about the size, color and location.  She is otherwise in good health and not had any recent illnesses.  Nothing makes it better.    Review of Systems  Constitutional: Negative.   Eyes: Negative.   Respiratory: Negative.    Cardiovascular: Negative.   Gastrointestinal: Negative.   Endocrine: Negative.   Genitourinary: Negative.   Musculoskeletal: Negative.   Skin:  Positive for color change.    Past Medical History:  Diagnosis Date   Healthy adult on routine physical examination    No pertinent past medical history     Past Surgical History:  Procedure Laterality Date   CESAREAN SECTION  2010   CESAREAN SECTION  06/28/2011   Procedure: CESAREAN SECTION;  Surgeon: Claiborne Billings A. Fogleman;  Location: [facility];  Service: Gynecology;  Laterality: N/A;   dental procedure      with gas- awake and very relaxed   TONSILLECTOMY AND ADENOIDECTOMY  11/04/1988     No current outpatient medications on file.   Objective:   Vitals:   06/04/22 0951  BP: 102/66  Pulse: 62  SpO2: 99%    Physical Exam Vitals reviewed.  Constitutional:      Appearance: Normal appearance.  Skin:    Coloration: Skin is not jaundiced.     Findings: Lesion present. No bruising.  Neurological:     Mental Status: She is alert.     Assessment & Plan:  Changing pigmented skin lesion  Recommend biopsy before doing any kind of laser. The patient would like to move ahead with excision.  Laser could be done if needed after it heals.  Patient understands that  there will be a scar.  She will need to be really cautious about sun exposure.  Pictures were obtained of the patient and placed in the chart with the patient's or guardian's permission.   [place], DO

## 2022-09-10 ENCOUNTER — Ambulatory Visit (INDEPENDENT_AMBULATORY_CARE_PROVIDER_SITE_OTHER): Payer: BC Managed Care – PPO | Admitting: Plastic Surgery

## 2022-09-10 ENCOUNTER — Other Ambulatory Visit (HOSPITAL_COMMUNITY)
Admission: RE | Admit: 2022-09-10 | Discharge: 2022-09-10 | Disposition: A | Discharge status: Home / Self Care | Payer: BC Managed Care – PPO | Source: Ambulatory Visit | Attending: Plastic Surgery | Admitting: Plastic Surgery

## 2022-09-10 ENCOUNTER — Encounter: Payer: Self-pay | Admitting: Plastic Surgery

## 2022-09-10 VITALS — BP 100/66 | HR 82 | Ht 63.0 in | Wt 148.0 lb

## 2022-09-10 DIAGNOSIS — D2239 Melanocytic nevi of other parts of face: Secondary | ICD-10-CM | POA: Diagnosis not present

## 2022-09-10 DIAGNOSIS — L819 Disorder of pigmentation, unspecified: Secondary | ICD-10-CM | POA: Insufficient documentation

## 2022-09-10 NOTE — Progress Notes (Signed)
Procedure Note  Preoperative Dx: Changing skin lesion right nose  Postoperative Dx: Same  Procedure: Excision changing skin lesion right nose 3 mm  Anesthesia: Lidocaine 1% with 1:100,000 epinephrine  Indication for Procedure: Aging skin lesion  Description of Procedure: Risks and complications were explained to the patient.  Consent was confirmed and the patient understands the risks and benefits.  The potential complications and alternatives were explained and the patient consents.  The patient expressed understanding the option of not having the procedure and the risks of a scar.  Time out was called and all information was confirmed to be correct.    The area was prepped and drapped.  Lidocaine 1% with epinepherine was injected in the subcutaneous area.  After waiting several minutes for the local to take affect a #15 blade was used to excise the area in an eliptical pattern.  The skin edges were reapproximated with 6-0 Monocryl.  A dressing was applied.  The patient was given instructions on how to care for the area and a follow up appointment.  Yvette Ross tolerated the procedure well and there were no complications. The specimen was sent to pathology.

## 2022-09-11 LAB — SURGICAL PATHOLOGY

## 2022-09-20 ENCOUNTER — Ambulatory Visit (INDEPENDENT_AMBULATORY_CARE_PROVIDER_SITE_OTHER): Payer: BC Managed Care – PPO | Admitting: Surgical

## 2022-09-20 DIAGNOSIS — L819 Disorder of pigmentation, unspecified: Secondary | ICD-10-CM

## 2022-09-20 NOTE — Progress Notes (Signed)
Patient is a 46 year old female here for follow-up after excision of skin lesion on her nose.  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  Patient is healing well.  6-0 Monocryl was removed.  Patient tolerated this well.  She has some questions about nonsurgical options and on injectable options for skin rejuvenation.  We discussed halo laser and BBL laser.  We discussed the difference between the 2.  Provided her with information about each.  She is going to think this over and will call if she would like to schedule one.  We will plan to see her on an as-needed basis.  There is no signs of infection on exam of her right nasal lesion excision site.  It is healing well.  Recommend keeping this area covered with sunscreen.  She can begin using scar cream in 1 week.

## 2023-09-18 ENCOUNTER — Other Ambulatory Visit (HOSPITAL_BASED_OUTPATIENT_CLINIC_OR_DEPARTMENT_OTHER): Payer: Self-pay | Admitting: Obstetrics

## 2023-09-18 DIAGNOSIS — Z8249 Family history of ischemic heart disease and other diseases of the circulatory system: Secondary | ICD-10-CM

## 2023-11-11 ENCOUNTER — Ambulatory Visit (HOSPITAL_BASED_OUTPATIENT_CLINIC_OR_DEPARTMENT_OTHER)
Admission: RE | Admit: 2023-11-11 | Discharge: 2023-11-11 | Disposition: A | Discharge status: Home / Self Care | Payer: Self-pay | Source: Ambulatory Visit | Attending: Obstetrics | Admitting: Obstetrics

## 2023-11-11 DIAGNOSIS — Z8249 Family history of ischemic heart disease and other diseases of the circulatory system: Secondary | ICD-10-CM | POA: Insufficient documentation
